# Patient Record
Sex: Female | Born: 1970 | Race: Black or African American | Hispanic: No | Marital: Single | Smoking: Current every day smoker
Health system: Southern US, Community
[De-identification: ages and names within clinical notes are randomized; demographics above are authoritative.]

## PROBLEM LIST (undated history)

## (undated) DIAGNOSIS — F32A Depression, unspecified: Secondary | ICD-10-CM

## (undated) DIAGNOSIS — I10 Essential (primary) hypertension: Secondary | ICD-10-CM

## (undated) DIAGNOSIS — Z973 Presence of spectacles and contact lenses: Secondary | ICD-10-CM

## (undated) DIAGNOSIS — D219 Benign neoplasm of connective and other soft tissue, unspecified: Secondary | ICD-10-CM

## (undated) DIAGNOSIS — G4733 Obstructive sleep apnea (adult) (pediatric): Secondary | ICD-10-CM

## (undated) DIAGNOSIS — G43909 Migraine, unspecified, not intractable, without status migrainosus: Secondary | ICD-10-CM

## (undated) DIAGNOSIS — E119 Type 2 diabetes mellitus without complications: Secondary | ICD-10-CM

## (undated) DIAGNOSIS — F419 Anxiety disorder, unspecified: Secondary | ICD-10-CM

## (undated) DIAGNOSIS — M549 Dorsalgia, unspecified: Secondary | ICD-10-CM

## (undated) HISTORY — DX: Obstructive sleep apnea (adult) (pediatric): G47.33

## (undated) HISTORY — DX: Type 2 diabetes mellitus without complications: E11.9

## (undated) HISTORY — PX: OTHER SURGICAL HISTORY: SHX169

---

## 1997-12-01 ENCOUNTER — Emergency Department (HOSPITAL_COMMUNITY): Admission: EM | Admit: 1997-12-01 | Discharge: 1997-12-01 | Payer: Self-pay | Admitting: Emergency Medicine

## 1997-12-26 ENCOUNTER — Inpatient Hospital Stay (HOSPITAL_COMMUNITY): Admission: AD | Admit: 1997-12-26 | Discharge: 1997-12-26 | Payer: Self-pay | Admitting: *Deleted

## 1998-03-04 ENCOUNTER — Emergency Department (HOSPITAL_COMMUNITY): Admission: EM | Admit: 1998-03-04 | Discharge: 1998-03-04 | Payer: Self-pay | Admitting: Emergency Medicine

## 2002-10-15 ENCOUNTER — Emergency Department (HOSPITAL_COMMUNITY): Admission: EM | Admit: 2002-10-15 | Discharge: 2002-10-15 | Payer: Self-pay | Admitting: Emergency Medicine

## 2004-02-27 ENCOUNTER — Other Ambulatory Visit: Admission: RE | Admit: 2004-02-27 | Discharge: 2004-02-27 | Payer: Self-pay | Admitting: Family Medicine

## 2005-04-08 ENCOUNTER — Emergency Department (HOSPITAL_COMMUNITY): Admission: EM | Admit: 2005-04-08 | Discharge: 2005-04-08 | Payer: Self-pay | Admitting: Emergency Medicine

## 2005-05-27 ENCOUNTER — Ambulatory Visit (HOSPITAL_COMMUNITY): Admission: RE | Admit: 2005-05-27 | Discharge: 2005-05-27 | Payer: Self-pay | Admitting: Chiropractic Medicine

## 2005-08-28 ENCOUNTER — Ambulatory Visit: Payer: Self-pay | Admitting: Family Medicine

## 2005-09-28 ENCOUNTER — Ambulatory Visit: Payer: Self-pay | Admitting: Family Medicine

## 2005-11-02 ENCOUNTER — Ambulatory Visit: Payer: Self-pay | Admitting: Family Medicine

## 2005-12-11 ENCOUNTER — Ambulatory Visit: Payer: Self-pay | Admitting: Family Medicine

## 2006-09-06 ENCOUNTER — Ambulatory Visit: Payer: Self-pay | Admitting: Family Medicine

## 2008-02-02 ENCOUNTER — Ambulatory Visit: Payer: Self-pay | Admitting: Family Medicine

## 2008-03-19 ENCOUNTER — Emergency Department (HOSPITAL_COMMUNITY): Admission: EM | Admit: 2008-03-19 | Discharge: 2008-03-19 | Payer: Self-pay | Admitting: Emergency Medicine

## 2008-03-26 ENCOUNTER — Encounter: Admission: RE | Admit: 2008-03-26 | Discharge: 2008-03-26 | Payer: Self-pay | Admitting: Family Medicine

## 2008-03-26 ENCOUNTER — Ambulatory Visit: Payer: Self-pay | Admitting: Family Medicine

## 2008-04-04 ENCOUNTER — Ambulatory Visit: Payer: Self-pay | Admitting: Family Medicine

## 2010-01-17 ENCOUNTER — Encounter: Admission: RE | Admit: 2010-01-17 | Discharge: 2010-01-17 | Payer: Self-pay | Admitting: Family Medicine

## 2010-01-24 ENCOUNTER — Ambulatory Visit (HOSPITAL_COMMUNITY): Admission: RE | Admit: 2010-01-24 | Discharge: 2010-01-24 | Payer: Self-pay | Admitting: Family Medicine

## 2010-04-20 ENCOUNTER — Encounter: Payer: Self-pay | Admitting: Chiropractic Medicine

## 2012-03-11 ENCOUNTER — Emergency Department (HOSPITAL_COMMUNITY): Payer: BC Managed Care – PPO

## 2012-03-11 ENCOUNTER — Encounter (HOSPITAL_COMMUNITY): Payer: Self-pay | Admitting: Emergency Medicine

## 2012-03-11 ENCOUNTER — Emergency Department (HOSPITAL_COMMUNITY)
Admission: EM | Admit: 2012-03-11 | Discharge: 2012-03-11 | Disposition: A | Discharge status: Home / Self Care | Payer: BC Managed Care – PPO | Attending: Emergency Medicine | Admitting: Emergency Medicine

## 2012-03-11 DIAGNOSIS — F17200 Nicotine dependence, unspecified, uncomplicated: Secondary | ICD-10-CM | POA: Insufficient documentation

## 2012-03-11 DIAGNOSIS — I10 Essential (primary) hypertension: Secondary | ICD-10-CM | POA: Insufficient documentation

## 2012-03-11 DIAGNOSIS — R0789 Other chest pain: Secondary | ICD-10-CM

## 2012-03-11 DIAGNOSIS — R071 Chest pain on breathing: Secondary | ICD-10-CM | POA: Insufficient documentation

## 2012-03-11 DIAGNOSIS — Z79899 Other long term (current) drug therapy: Secondary | ICD-10-CM | POA: Insufficient documentation

## 2012-03-11 HISTORY — DX: Essential (primary) hypertension: I10

## 2012-03-11 LAB — BASIC METABOLIC PANEL
Calcium: 9 mg/dL (ref 8.4–10.5)
Chloride: 102 mEq/L (ref 96–112)
Creatinine, Ser: 0.69 mg/dL (ref 0.50–1.10)
GFR calc non Af Amer: >90 mL/min (ref >90)
Potassium: 3.9 mEq/L (ref 3.5–5.1)

## 2012-03-11 LAB — CBC WITH DIFFERENTIAL/PLATELET
Eosinophils Relative: 2 % (ref 0–5)
MCH: 28.5 pg (ref 26.0–34.0)
MCHC: 34.3 g/dL (ref 30.0–36.0)
Monocytes Absolute: 0.5 10*3/uL (ref 0.1–1.0)
Neutro Abs: 3.5 10*3/uL (ref 1.7–7.7)
Platelets: 216 10*3/uL (ref 150–400)
WBC: 6.5 10*3/uL (ref 4.0–10.5)

## 2012-03-11 LAB — POCT I-STAT TROPONIN I: Troponin i, poc: 0 ng/mL (ref 0.00–0.08)

## 2012-03-11 MED ORDER — MORPHINE SULFATE 4 MG/ML IJ SOLN
4.0000 mg | Freq: Once | INTRAMUSCULAR | Status: DC
  Filled 2012-03-11: qty 1

## 2012-03-11 MED ORDER — HYDROCODONE-ACETAMINOPHEN 5-325 MG PO TABS: 1.0000 | ORAL_TABLET | Freq: Four times a day (QID) | ORAL | Status: DC | PRN

## 2012-03-11 MED ORDER — IBUPROFEN 800 MG PO TABS: 800.0000 mg | ORAL_TABLET | Freq: Three times a day (TID) | ORAL | Status: DC | PRN

## 2012-03-11 MED ORDER — HYDROCODONE-ACETAMINOPHEN 5-325 MG PO TABS
1.0000 | ORAL_TABLET | Freq: Once | ORAL | Status: AC
  Administered 2012-03-11: 1 via ORAL
  Filled 2012-03-11: qty 1

## 2012-03-11 MED ORDER — IBUPROFEN 800 MG PO TABS
800.0000 mg | ORAL_TABLET | Freq: Once | ORAL | Status: AC
  Administered 2012-03-11: 800 mg via ORAL
  Filled 2012-03-11: qty 1

## 2012-03-11 MED ORDER — KETOROLAC TROMETHAMINE 30 MG/ML IJ SOLN
30.0000 mg | Freq: Once | INTRAMUSCULAR | Status: DC
  Filled 2012-03-11: qty 1

## 2012-03-11 NOTE — ED Notes (Signed)
Pt c/o left sided CP worse with movement and palpation starting last night; pt sts hx of similar in past but this has lasted longer

## 2012-03-12 NOTE — ED Provider Notes (Signed)
History     CSN: [id number]  Arrival date & time 03/11/12  8295   First MD Initiated Contact with Patient 03/11/12 1215      Chief Complaint  Patient presents with  . Chest Pain    (Consider location/radiation/quality/duration/timing/severity/associated sxs/prior treatment) HPI The patient began having chest pain yesterday that has been constant. The patient states that she took Tylenol with minimal relief. The patient states that she has more pain with movement, palpation and deep breathing. The patient denies shortness of breath, headache, nausea, vomiting, abdominal pain, back pain, fever, cough, dizziness, or syncope. The patient states that she has had similar pain in the past. She states that it was a musclar strain. The patient denies any trauma. The patient does do lifting at work.  Past Medical History  Diagnosis Date  . Hypertension     History reviewed. No pertinent past surgical history.  History reviewed. No pertinent family history.  History  Substance Use Topics  . Smoking status: Current Every Day Smoker  . Smokeless tobacco: Not on file  . Alcohol Use: Yes    OB History    Grav Para Term Preterm Abortions TAB SAB Ect Mult Living                  Review of Systems All other systems negative except as documented in the HPI. All pertinent positives and negatives as reviewed in the HPI.  Allergies  Review of patient's allergies indicates no known allergies.  Home Medications   Current Outpatient Rx  Name  Route  Sig  Dispense  Refill  . ACETAMINOPHEN 500 MG PO TABS   Oral   Take 1,000 mg by mouth every 6 (six) hours as needed. For pain         . AMLODIPINE-OLMESARTAN 5-20 MG PO TABS   Oral   Take 1 tablet by mouth daily.         Marland Kitchen HYDROCODONE-ACETAMINOPHEN 5-325 MG PO TABS   Oral   Take 1 tablet by mouth every 6 (six) hours as needed for pain.   15 tablet   0   . IBUPROFEN 800 MG PO TABS   Oral   Take 1 tablet (800 mg total) by mouth  every 8 (eight) hours as needed for pain.   21 tablet   0     BP 138/89  Pulse 68  Temp 98.7 F (37.1 C) (Oral)  Resp 16  SpO2 99%  LMP 02/26/2012  Physical Exam  Nursing note and vitals reviewed. Constitutional: She is oriented to person, place, and time. She appears well-developed and well-nourished.  HENT:  Head: Normocephalic and atraumatic.  Mouth/Throat: Oropharynx is clear and moist.  Eyes: Pupils are equal, round, and reactive to light.  Neck: Normal range of motion. Neck supple.  Cardiovascular: Normal rate, regular rhythm and normal heart sounds.  Exam reveals no gallop and no friction rub.   No murmur heard. Pulmonary/Chest: Effort normal and breath sounds normal. She exhibits tenderness.    Neurological: She is alert and oriented to person, place, and time.  Skin: Skin is warm and dry. No rash noted.    ED Course  Procedures (including critical care time)  Labs Reviewed  BASIC METABOLIC PANEL - Abnormal; Notable for the following:    Sodium 134 (*)     Glucose, Bld 110 (*)     All other components within normal limits  CBC WITH DIFFERENTIAL  POCT I-STAT TROPONIN I  POCT I-STAT TROPONIN I  LAB REPORT - SCANNED   Dg Chest 2 View  03/11/2012  *RADIOLOGY REPORT*  Clinical Data: Chest pain  CHEST - 2 VIEW  Comparison: 04/08/2005  Findings: Lungs are clear. No pleural effusion or pneumothorax.  Cardiomediastinal silhouette is within normal limits.  Mild degenerative changes of the visualized thoracolumbar spine.  IMPRESSION: No evidence of acute cardiopulmonary disease.   Original Report Authenticated By: Charline Bills, M.D.      1. Chest wall pain       MDM  The patient most likely has chest wall pain based on her HPI and PE. The patient is advised to return here as needed.   MDM Reviewed: nursing note and vitals Reviewed previous: labs Interpretation: labs, ECG and x-ray    Date: 03/12/2012  Rate: 84  Rhythm: normal sinus rhythm  QRS Axis:  normal  Intervals: normal  ST/T Wave abnormalities: normal  Conduction Disutrbances:none  Narrative Interpretation:   Old EKG Reviewed: none available         Carlyle Dolly, PA-C 03/12/12 [phone]

## 2012-03-13 NOTE — ED Provider Notes (Signed)
Medical screening examination/treatment/procedure(s) were performed by non-physician practitioner and as supervising physician I was immediately available for consultation/collaboration.   Gerhard Munch, MD 03/13/12 [phone]

## 2012-07-18 ENCOUNTER — Emergency Department (INDEPENDENT_AMBULATORY_CARE_PROVIDER_SITE_OTHER)
Admission: EM | Admit: 2012-07-18 | Discharge: 2012-07-18 | Disposition: A | Discharge status: Home / Self Care | Payer: BC Managed Care – PPO | Source: Home / Self Care | Attending: Emergency Medicine | Admitting: Emergency Medicine

## 2012-07-18 ENCOUNTER — Encounter (HOSPITAL_COMMUNITY): Payer: Self-pay | Admitting: Emergency Medicine

## 2012-07-18 DIAGNOSIS — J309 Allergic rhinitis, unspecified: Secondary | ICD-10-CM

## 2012-07-18 MED ORDER — PREDNISONE 20 MG PO TABS: 20.0000 mg | ORAL_TABLET | Freq: Two times a day (BID) | ORAL | Status: DC

## 2012-07-18 MED ORDER — MONTELUKAST SODIUM 10 MG PO TABS: 10.0000 mg | ORAL_TABLET | Freq: Every day | ORAL | Status: DC

## 2012-07-18 MED ORDER — FEXOFENADINE-PSEUDOEPHED ER 60-120 MG PO TB12: 1.0000 | ORAL_TABLET | Freq: Two times a day (BID) | ORAL | Status: DC

## 2012-07-18 MED ORDER — METHYLPREDNISOLONE ACETATE 80 MG/ML IJ SUSP
80.0000 mg | Freq: Once | INTRAMUSCULAR | Status: AC
  Administered 2012-07-18: 80 mg via INTRAMUSCULAR

## 2012-07-18 MED ORDER — AMOXICILLIN 500 MG PO CAPS: 1000.0000 mg | ORAL_CAPSULE | Freq: Three times a day (TID) | ORAL | Status: DC

## 2012-07-18 MED ORDER — FLUTICASONE PROPIONATE 50 MCG/ACT NA SUSP: 2.0000 | Freq: Every day | NASAL | Status: DC

## 2012-07-18 NOTE — ED Notes (Signed)
Pt c/o allergies since Sat Sx include: HA, nasal congestion, runny nose Denies: f/v/n/d Taking Nyquil for allergies and benadryl w/temp relief  She is alert and oriented w/no signs of acute distress.

## 2012-07-18 NOTE — ED Provider Notes (Signed)
Chief Complaint:   Chief Complaint  Patient presents with  . Allergies    History of Present Illness:   Angel Tucker is a 42 year old female with a three-year history of recurring springtime pollen allergies. She had a flareup beginning 3 days ago with headache, nasal congestion, rhinorrhea, sneezing, nasal itching, itchy watery eyes, sore throat, postnasal drip, slight cough, abdominal pain, and constipation. She denies fever or chills. She's had no wheezing or difficulty breathing. No nausea or vomiting.  Review of Systems:  Other than noted above, the patient denies any of the following symptoms. Systemic:  No fever, chills, sweats, fatigue, myalgias, headache, or anorexia. Eye:  No redness, itching, watering, pain or drainage. ENT:  No earache, ear congestion, nasal congestion, sneezing, nasal itching, rhinorrhea, sinus pressure, sinus pain, post nasal drip, or sore throat. Lungs:  No cough, sputum production, wheezing, shortness of breath, or chest pain. Skin:  No rash or itching.  PMFSH:  Past medical history, family history, social history, meds, and allergies were reviewed.  No history of asthma. No use of tobacco. She has no medication allergies. She takes Azore for high blood pressure. She's not pregnant or breast-feeding.  Physical Exam:   Vital signs:  BP 148/107  Pulse 106  Temp(Src) 99.7 F (37.6 C) (Oral)  Resp 24  SpO2 95% General:  Alert, in no distress. Eye:  No conjunctival injection or drainage. Lids were normal. ENT:  TMs and canals were normal, without erythema or inflammation.  Nasal mucosa was congested, pale and boggy with clear, watery drainage.  Mucous membranes were moist.  Pharynx was clear, without exudate or drainage.  There were no oral ulcerations or lesions. Neck:  Supple, no adenopathy, tenderness or mass. Lungs:  No respiratory distress.  Lungs were clear to auscultation, without wheezes, rales or rhonchi.  Breath sounds were clear and equal  bilaterally. Heart:  Regular rhythm, without gallops, murmers or rubs. Skin:  Clear, warm, and dry, without rash or lesions.  Course in Urgent Care Center:   Given Depo-Medrol 80 mg IM.  Assessment:  The encounter diagnosis was Allergic rhinitis.  Plan:   1.  The following meds were prescribed:   New Prescriptions   AMOXICILLIN (AMOXIL) 500 MG CAPSULE    Take 2 capsules (1,000 mg total) by mouth 3 (three) times daily.   FEXOFENADINE-PSEUDOEPHEDRINE (ALLEGRA-D) 60-120 MG PER TABLET    Take 1 tablet by mouth every 12 (twelve) hours.   FLUTICASONE (FLONASE) 50 MCG/ACT NASAL SPRAY    Place 2 sprays into the nose daily.   MONTELUKAST (SINGULAIR) 10 MG TABLET    Take 1 tablet (10 mg total) by mouth at bedtime.   PREDNISONE (DELTASONE) 20 MG TABLET    Take 1 tablet (20 mg total) by mouth 2 (two) times daily.   2.  The patient was instructed in symptomatic care and handouts were given. 3.  The patient was told to return if becoming worse in any way, if no better in 3 or 4 days, and given some red flag symptoms such as fever or difficulty breathing that would indicate earlier return. 4.  The patient was also instructed in allergen avoidance.  Follow up:  The patient was told to follow up with Dr. Schuyler Callas for allergy evaluation and treatment.    Reuben Likes, MD 07/18/12 [phone]

## 2014-09-04 ENCOUNTER — Ambulatory Visit: Payer: Self-pay | Admitting: Family

## 2014-09-04 ENCOUNTER — Encounter: Payer: Self-pay | Admitting: Family

## 2014-09-04 ENCOUNTER — Encounter (INDEPENDENT_AMBULATORY_CARE_PROVIDER_SITE_OTHER): Payer: Self-pay

## 2014-09-04 ENCOUNTER — Ambulatory Visit (INDEPENDENT_AMBULATORY_CARE_PROVIDER_SITE_OTHER): Payer: BLUE CROSS/BLUE SHIELD | Admitting: Family

## 2014-09-04 VITALS — BP 160/98 | HR 86 | Temp 98.4°F | Resp 18 | Ht 62.0 in | Wt 219.1 lb

## 2014-09-04 DIAGNOSIS — R221 Localized swelling, mass and lump, neck: Secondary | ICD-10-CM

## 2014-09-04 DIAGNOSIS — I10 Essential (primary) hypertension: Secondary | ICD-10-CM | POA: Diagnosis not present

## 2014-09-04 DIAGNOSIS — Z1231 Encounter for screening mammogram for malignant neoplasm of breast: Secondary | ICD-10-CM | POA: Diagnosis not present

## 2014-09-04 HISTORY — DX: Encounter for screening mammogram for malignant neoplasm of breast: Z12.31

## 2014-09-04 HISTORY — DX: Localized swelling, mass and lump, neck: R22.1

## 2014-09-04 HISTORY — DX: Essential (primary) hypertension: I10

## 2014-09-04 MED ORDER — VALSARTAN-HYDROCHLOROTHIAZIDE 80-12.5 MG PO TABS: 1.0000 | ORAL_TABLET | Freq: Every day | ORAL | Status: DC

## 2014-09-04 NOTE — Progress Notes (Signed)
Pre visit review using our clinic review tool, if applicable. No additional management support is needed unless otherwise documented below in the visit note. 

## 2014-09-04 NOTE — Assessment & Plan Note (Signed)
Referral for mammogram scheduling sent.

## 2014-09-04 NOTE — Progress Notes (Signed)
Subjective:    Patient ID: Angel Tucker, female    DOB: 1970/11/06, 44 y.o.   MRN: [id number]  Chief Complaint  Patient presents with  . Establish Care    has a mass on the back of her neck that she would like checked out, x1 month     HPI:  OSA [name] is a 44 y.o. female with a PMH of hypertension who presents today for an office visit to establish care.     1.) Mass on neck - Associated symptom a bump located on the back of her neck has been there for about a month. Indicates there was some pain that is described as sharp pain with a severity of 10/10. Modifying factors include Aleve which did help with the pain. Recently has improved some and the pain has slowly improved. Denies recent antibiotic use.  2.) Hypertension - Not currently maintained on medication. In the past she has been prescribed Azor. Has not taken the medication in about a year and has been managing it with lifestyle. Does have occasional pressure headache.   3.) Need for Mammogram - due for mammogram in requesting referral to have mammogram completed.  Allergies  Allergen Reactions  . Pollen Extract      Outpatient Prescriptions Prior to Visit  Medication Sig Dispense Refill  . acetaminophen (TYLENOL) 500 MG tablet Take 1,000 mg by mouth every 6 (six) hours as needed. For pain    . amLODipine-olmesartan (AZOR) 5-20 MG per tablet Take 1 tablet by mouth daily.    Marland Kitchen amoxicillin (AMOXIL) 500 MG capsule Take 2 capsules (1,000 mg total) by mouth 3 (three) times daily. 60 capsule 0  . fexofenadine-pseudoephedrine (ALLEGRA-D) 60-120 MG per tablet Take 1 tablet by mouth every 12 (twelve) hours. 30 tablet 5  . fluticasone (FLONASE) 50 MCG/ACT nasal spray Place 2 sprays into the nose daily. 16 g 3  . HYDROcodone-acetaminophen (NORCO/VICODIN) 5-325 MG per tablet Take 1 tablet by mouth every 6 (six) hours as needed for pain. 15 tablet 0  . ibuprofen (ADVIL,MOTRIN) 800 MG tablet Take 1 tablet (800 mg total) by  mouth every 8 (eight) hours as needed for pain. 21 tablet 0  . montelukast (SINGULAIR) 10 MG tablet Take 1 tablet (10 mg total) by mouth at bedtime. 30 tablet 3  . predniSONE (DELTASONE) 20 MG tablet Take 1 tablet (20 mg total) by mouth 2 (two) times daily. 10 tablet 0   No facility-administered medications prior to visit.     Past Medical History  Diagnosis Date  . Hypertension      History reviewed. No pertinent past surgical history.   Family History  Problem Relation Age of Onset  . Breast cancer Mother   . Hypertension Mother   . Diabetes Father   . Hypertension Father      History   Social History  . Marital Status: Married    Spouse Name: N/A  . Number of Children: 3  . Years of Education: 14   Occupational History  . Collector    Social History Main Topics  . Smoking status: Current Every Day Smoker -- 0.50 packs/day for 20 years    Types: Cigarettes  . Smokeless tobacco: Never Used  . Alcohol Use: No  . Drug Use: No  . Sexual Activity: Yes    Birth Control/ Protection: None   Other Topics Concern  . Not on file   Social History Narrative   Fun:     Review of  Systems  Constitutional: Negative for fever and chills.  Respiratory: Negative for chest tightness and shortness of breath.   Cardiovascular: Negative for chest pain, palpitations and leg swelling.  Neurological: Positive for headaches.      Objective:    BP 160/98 mmHg  Pulse 86  Temp(Src) 98.4 F (36.9 C) (Oral)  Resp 18  Ht 5\' 2"  (1.575 m)  Wt 219 lb 1.9 oz (99.392 kg)  BMI 40.07 kg/m2  SpO2 99% Nursing note and vital signs reviewed.  Physical Exam  Constitutional: She is oriented to person, place, and time. She appears well-developed and well-nourished. No distress.  Cardiovascular: Normal rate, regular rhythm, normal heart sounds and intact distal pulses.   Pulmonary/Chest: Effort normal and breath sounds normal.  Lymphadenopathy:  Small 1 mm, firm, nonmobile nontender  mass noted in the area of the cervical lymph nodes.  Neurological: She is alert and oriented to person, place, and time.  Skin: Skin is warm and dry.  Psychiatric: She has a normal mood and affect. Her behavior is normal. Judgment and thought content normal.       Assessment & Plan:   Problem List Items Addressed This Visit      Cardiovascular and Mediastinum   Essential hypertension    Previous history of hypertension and not currently maintained on medication. Blood pressure today greater than goal 140/90. Discussed risks and benefits of starting medication. Start Diovan HCT. Monitor blood pressure at home. Follow-up in one month to determine effectiveness.      Relevant Medications   valsartan-hydrochlorothiazide (DIOVAN-HCT) 80-12.5 MG per tablet     Other   Lump in neck - Primary    Exam consistent with potential lymph node. Obtain ultrasound for confirmation. Follow-up pending imaging results.      Relevant Orders   US Soft Tissue Head/Neck   Encounter for screening mammogram for breast cancer    Referral for mammogram scheduling sent.      Relevant Orders   MM DIGITAL SCREENING BILATERAL

## 2014-09-04 NOTE — Assessment & Plan Note (Signed)
Exam consistent with potential lymph node. Obtain ultrasound for confirmation. Follow-up pending imaging results.

## 2014-09-04 NOTE — Patient Instructions (Signed)
Thank you for choosing [facility].  Summary/Instructions:  Your prescription(s) have been submitted to your pharmacy or been printed and provided for you. Please take as directed and contact our office if you believe you are having problem(s) with the medication(s) or have any questions.  Referrals have been made during this visit. You should expect to hear back from our schedulers in about 7-10 days in regards to establishing an appointment with the specialists we discussed.   If your symptoms worsen or fail to improve, please contact our office for further instruction, or in case of emergency go directly to the emergency room at the closest medical facility.   Hypertension Hypertension, commonly called high blood pressure, is when the force of blood pumping through your arteries is too strong. Your arteries are the blood vessels that carry blood from your heart throughout your body. A blood pressure reading consists of a higher number over a lower number, such as 110/72. The higher number (systolic) is the pressure inside your arteries when your heart pumps. The lower number (diastolic) is the pressure inside your arteries when your heart relaxes. Ideally you want your blood pressure below 120/80. Hypertension forces your heart to work harder to pump blood. Your arteries may become narrow or stiff. Having hypertension puts you at risk for heart disease, stroke, and other problems.  RISK FACTORS Some risk factors for high blood pressure are controllable. Others are not.  Risk factors you cannot control include:   Race. You may be at higher risk if you are African American.  Age. Risk increases with age.  Gender. Men are at higher risk than women before age 39 years. After age 13, women are at higher risk than men. Risk factors you can control include:  Not getting enough exercise or physical activity.  Being overweight.  Getting too much fat, sugar, calories, or salt in your  diet.  Drinking too much alcohol. SIGNS AND SYMPTOMS Hypertension does not usually cause signs or symptoms. Extremely high blood pressure (hypertensive crisis) may cause headache, anxiety, shortness of breath, and nosebleed. DIAGNOSIS  To check if you have hypertension, your health care provider will measure your blood pressure while you are seated, with your arm held at the level of your heart. It should be measured at least twice using the same arm. Certain conditions can cause a difference in blood pressure between your right and left arms. A blood pressure reading that is higher than normal on one occasion does not mean that you need treatment. If one blood pressure reading is high, ask your health care provider about having it checked again. TREATMENT  Treating high blood pressure includes making lifestyle changes and possibly taking medicine. Living a healthy lifestyle can help lower high blood pressure. You may need to change some of your habits. Lifestyle changes may include:  Following the DASH diet. This diet is high in fruits, vegetables, and whole grains. It is low in salt, red meat, and added sugars.  Getting at least 2 hours of brisk physical activity every week.  Losing weight if necessary.  Not smoking.  Limiting alcoholic beverages.  Learning ways to reduce stress. If lifestyle changes are not enough to get your blood pressure under control, your health care provider may prescribe medicine. You may need to take more than one. Work closely with your health care provider to understand the risks and benefits. HOME CARE INSTRUCTIONS  Have your blood pressure rechecked as directed by your health care provider.  Take medicines only as directed by your health care provider. Follow the directions carefully. Blood pressure medicines must be taken as prescribed. The medicine does not work as well when you skip doses. Skipping doses also puts you at risk for problems.   Do not  smoke.   Monitor your blood pressure at home as directed by your health care provider. SEEK MEDICAL CARE IF:   You think you are having a reaction to medicines taken.  You have recurrent headaches or feel dizzy.  You have swelling in your ankles.  You have trouble with your vision. SEEK IMMEDIATE MEDICAL CARE IF:  You develop a severe headache or confusion.  You have unusual weakness, numbness, or feel faint.  You have severe chest or abdominal pain.  You vomit repeatedly.  You have trouble breathing. MAKE SURE YOU:   Understand these instructions.  Will watch your condition.  Will get help right away if you are not doing well or get worse. Document Released: 03/16/2005 Document Revised: 07/31/2013 Document Reviewed: 01/06/2013 [facility] Patient Information 2015 [place], [place]. This information is not intended to replace advice given to you by your health care provider. Make sure you discuss any questions you have with your health care provider.

## 2014-09-04 NOTE — Assessment & Plan Note (Signed)
Previous history of hypertension and not currently maintained on medication. Blood pressure today greater than goal 140/90. Discussed risks and benefits of starting medication. Start Diovan HCT. Monitor blood pressure at home. Follow-up in one month to determine effectiveness.

## 2014-09-11 ENCOUNTER — Ambulatory Visit
Admission: RE | Admit: 2014-09-11 | Discharge: 2014-09-11 | Disposition: A | Discharge status: Home / Self Care | Payer: BLUE CROSS/BLUE SHIELD | Source: Ambulatory Visit | Attending: Family | Admitting: Family

## 2014-09-11 ENCOUNTER — Telehealth: Payer: Self-pay | Admitting: Family

## 2014-09-11 DIAGNOSIS — R221 Localized swelling, mass and lump, neck: Secondary | ICD-10-CM

## 2014-09-11 DIAGNOSIS — Z1231 Encounter for screening mammogram for malignant neoplasm of breast: Secondary | ICD-10-CM

## 2014-09-11 NOTE — Telephone Encounter (Signed)
Please inform patient that her mammogram was normal and her ultrasound of her neck showed a lymph node that is most likely benign in nature. We will monitor this as needed.

## 2014-09-12 NOTE — Telephone Encounter (Signed)
Left detailed message letting pt know results below.

## 2014-10-02 ENCOUNTER — Ambulatory Visit: Payer: BLUE CROSS/BLUE SHIELD | Admitting: Family

## 2014-10-02 DIAGNOSIS — Z0289 Encounter for other administrative examinations: Secondary | ICD-10-CM

## 2014-10-04 ENCOUNTER — Other Ambulatory Visit: Payer: Self-pay

## 2014-10-04 DIAGNOSIS — I10 Essential (primary) hypertension: Secondary | ICD-10-CM

## 2014-10-04 MED ORDER — VALSARTAN-HYDROCHLOROTHIAZIDE 80-12.5 MG PO TABS: 1.0000 | ORAL_TABLET | Freq: Every day | ORAL | Status: DC

## 2017-08-04 ENCOUNTER — Ambulatory Visit (INDEPENDENT_AMBULATORY_CARE_PROVIDER_SITE_OTHER): Payer: 59 | Admitting: Family

## 2017-08-04 ENCOUNTER — Ambulatory Visit: Payer: BLUE CROSS/BLUE SHIELD | Admitting: Family Medicine

## 2017-08-04 ENCOUNTER — Encounter: Payer: Self-pay | Admitting: Family

## 2017-08-04 VITALS — BP 170/98 | HR 87 | Temp 98.4°F | Ht 62.0 in | Wt 198.0 lb

## 2017-08-04 DIAGNOSIS — Z720 Tobacco use: Secondary | ICD-10-CM

## 2017-08-04 DIAGNOSIS — I10 Essential (primary) hypertension: Secondary | ICD-10-CM | POA: Diagnosis not present

## 2017-08-04 DIAGNOSIS — R4689 Other symptoms and signs involving appearance and behavior: Secondary | ICD-10-CM | POA: Diagnosis not present

## 2017-08-04 MED ORDER — VALSARTAN-HYDROCHLOROTHIAZIDE 160-12.5 MG PO TABS: 1.0000 | ORAL_TABLET | Freq: Every day | ORAL | 1 refills | Status: DC

## 2017-08-04 NOTE — Progress Notes (Signed)
Angel Tucker is a 47 y.o. female with the following history as recorded in [employer]:  Patient Active Problem List   Diagnosis Date Noted  . Lump in neck 09/04/2014  . Essential hypertension 09/04/2014  . Encounter for screening mammogram for breast cancer 09/04/2014    Current Outpatient Medications  Medication Sig Dispense Refill  . valsartan-hydrochlorothiazide (DIOVAN HCT) 160-12.5 MG tablet Take 1 tablet by mouth daily. 30 tablet 1   No current facility-administered medications for this visit.     Allergies: Pollen extract  Past Medical History:  Diagnosis Date  . Hypertension     History reviewed. No pertinent surgical history.  Family History  Problem Relation Age of Onset  . Breast cancer Mother   . Hypertension Mother   . Diabetes Father   . Hypertension Father     Social History   Tobacco Use  . Smoking status: Current Every Day Smoker    Packs/day: 0.50    Years: 20.00    Pack years: 10.00    Types: Cigarettes  . Smokeless tobacco: Never Used  Substance Use Topics  . Alcohol use: No    Subjective:  Patient has history of hypertension; has not been seen in this office in almost 3 years; she did not have a scheduled appointment today- she walked in asking for her medication to be refilled since her eye doctor told her that her blood pressure was elevated. Has been taking Diovan HCT 80/12.5 mg sporadically on and off for the past few years- notes she has been off any type of medication for at least the past 8 months; went to the eye doctor last Friday and pressure was noted to be very high at that appointment- she thinks the pressure was at 190/130; has purchased a home cuff in the past week and is monitoring regularly- numbers are comparable to what is seen today; has had some chest tightness; + smoker;   LMP- 3 weeks ago; planning to see GYN to establish care;   Objective:  Vitals:   08/04/17 1153  BP: (!) 170/98  Pulse: 87  Temp: 98.4 F (36.9 C)   TempSrc: Oral  SpO2: 96%  Weight: 198 lb (89.8 kg)  Height: 5\' 2"  (1.575 m)    General: Well developed, well nourished, in no acute distress  Skin : Warm and dry.  Head: Normocephalic and atraumatic  Eyes: Sclera and conjunctiva clear; pupils round and reactive to light; extraocular movements intact  Ears: External normal; canals clear; tympanic membranes normal  Oropharynx: Pink, supple. No suspicious lesions  Neck: Supple without thyromegaly, adenopathy  Lungs: Respirations unlabored; clear to auscultation bilaterally without wheeze, rales, rhonchi  CVS exam: normal rate and regular rhythm.  Abdomen: Soft; nontender; nondistended; normoactive bowel sounds; no masses or hepatosplenomegaly  Musculoskeletal: No deformities; no active joint inflammation  Extremities: No edema, cyanosis, clubbing  Vessels: Symmetric bilaterally  Neurologic: Alert and oriented; speech intact; face symmetrical; moves all extremities well; CNII-XII intact without focal deficit  Assessment:  1. Essential hypertension   2. Non-compliant behavior   3. Tobacco abuse     Plan:  1. Update EKG today; patient prefers to do labs at next OV; Rx for Diovan HCT 160/ 12.5; follow-up in 2 weeks; 2. Stressed need to take medication as prescribe and follow-up regularly; discussed risk of heart attack, stroke, dialysis with uncontrolled hypertension. 3. Encouraged to quit- she admits she is not ready at this time;  She plans to see GYN in follow-up also.  Return in about 2 weeks (around 08/18/2017) for blood pressure follow-up.  Orders Placed This Encounter  Procedures  . EKG 12-Lead    Requested Prescriptions   Signed Prescriptions Disp Refills  . valsartan-hydrochlorothiazide (DIOVAN HCT) 160-12.5 MG tablet 30 tablet 1    Sig: Take 1 tablet by mouth daily.

## 2017-08-06 ENCOUNTER — Ambulatory Visit: Payer: Self-pay | Admitting: Family

## 2017-08-06 ENCOUNTER — Ambulatory Visit: Payer: BLUE CROSS/BLUE SHIELD | Admitting: Family

## 2017-08-20 ENCOUNTER — Encounter: Payer: Self-pay | Admitting: Family

## 2017-08-20 ENCOUNTER — Ambulatory Visit: Payer: 59 | Admitting: Family

## 2017-08-20 ENCOUNTER — Ambulatory Visit (INDEPENDENT_AMBULATORY_CARE_PROVIDER_SITE_OTHER): Payer: 59 | Admitting: Family

## 2017-08-20 ENCOUNTER — Other Ambulatory Visit (INDEPENDENT_AMBULATORY_CARE_PROVIDER_SITE_OTHER): Payer: 59

## 2017-08-20 VITALS — BP 162/110 | HR 85 | Temp 98.4°F | Ht 62.0 in | Wt 198.0 lb

## 2017-08-20 DIAGNOSIS — I10 Essential (primary) hypertension: Secondary | ICD-10-CM

## 2017-08-20 DIAGNOSIS — R5383 Other fatigue: Secondary | ICD-10-CM

## 2017-08-20 LAB — CBC WITH DIFFERENTIAL/PLATELET
BASOS PCT: 1 % (ref 0.0–3.0)
Basophils Absolute: 0.1 10*3/uL (ref 0.0–0.1)
EOS PCT: 2.8 % (ref 0.0–5.0)
Eosinophils Absolute: 0.2 10*3/uL (ref 0.0–0.7)
HEMATOCRIT: 36.8 % (ref 36.0–46.0)
Hemoglobin: 11.8 g/dL — ABNORMAL LOW (ref 12.0–15.0)
LYMPHS ABS: 2.6 10*3/uL (ref 0.7–4.0)
LYMPHS PCT: 40.6 % (ref 12.0–46.0)
MCHC: 32.1 g/dL (ref 30.0–36.0)
MCV: 79.9 fl (ref 78.0–100.0)
MONOS PCT: 7.2 % (ref 3.0–12.0)
Monocytes Absolute: 0.5 10*3/uL (ref 0.1–1.0)
NEUTROS ABS: 3 10*3/uL (ref 1.4–7.7)
Neutrophils Relative %: 48.4 % (ref 43.0–77.0)
PLATELETS: 244 10*3/uL (ref 150.0–400.0)
RBC: 4.61 Mil/uL (ref 3.87–5.11)
RDW: 14.9 % (ref 11.5–15.5)
WBC: 6.3 10*3/uL (ref 4.0–10.5)

## 2017-08-20 LAB — COMPREHENSIVE METABOLIC PANEL
ALK PHOS: 82 U/L (ref 39–117)
ALT: 30 U/L (ref 0–35)
AST: 39 U/L — ABNORMAL HIGH (ref 0–37)
Albumin: 3.7 g/dL (ref 3.5–5.2)
BILIRUBIN TOTAL: 0.4 mg/dL (ref 0.2–1.2)
BUN: 8 mg/dL (ref 6–23)
CO2: 30 mEq/L (ref 19–32)
CREATININE: 0.84 mg/dL (ref 0.40–1.20)
Calcium: 8.9 mg/dL (ref 8.4–10.5)
Chloride: 104 mEq/L (ref 96–112)
GFR: 93.79 mL/min (ref >60.00)
GLUCOSE: 99 mg/dL (ref 70–99)
Potassium: 3.7 mEq/L (ref 3.5–5.1)
Sodium: 140 mEq/L (ref 135–145)
TOTAL PROTEIN: 7.1 g/dL (ref 6.0–8.3)

## 2017-08-20 LAB — TSH: TSH: 1.06 u[IU]/mL (ref 0.35–4.50)

## 2017-08-20 LAB — VITAMIN B12: Vitamin B-12: 757 pg/mL (ref 211–911)

## 2017-08-20 LAB — VITAMIN D 25 HYDROXY (VIT D DEFICIENCY, FRACTURES): VITD: 20.58 ng/mL — ABNORMAL LOW (ref 30.00–100.00)

## 2017-08-20 MED ORDER — VALSARTAN-HYDROCHLOROTHIAZIDE 320-25 MG PO TABS: 1.0000 | ORAL_TABLET | Freq: Every day | ORAL | 1 refills | Status: DC

## 2017-08-20 NOTE — Progress Notes (Signed)
Angel Tucker is a 47 y.o. female with the following history as recorded in EpicCare:  Patient Active Problem List   Diagnosis Date Noted  . Lump in neck 09/04/2014  . Essential hypertension 09/04/2014  . Encounter for screening mammogram for breast cancer 09/04/2014    Current Outpatient Medications  Medication Sig Dispense Refill  . valsartan-hydrochlorothiazide (DIOVAN HCT) 320-25 MG tablet Take 1 tablet by mouth daily. 30 tablet 1   No current facility-administered medications for this visit.     Allergies: Pollen extract  Past Medical History:  Diagnosis Date  . Hypertension     History reviewed. No pertinent surgical history.  Family History  Problem Relation Age of Onset  . Breast cancer Mother   . Hypertension Mother   . Diabetes Father   . Hypertension Father     Social History   Tobacco Use  . Smoking status: Current Every Day Smoker    Packs/day: 0.50    Years: 20.00    Pack years: 10.00    Types: Cigarettes  . Smokeless tobacco: Never Used  Substance Use Topics  . Alcohol use: No    Subjective:  2 week follow-up on hypertension; Patient presents to follow up on hypertension; was started on Diovan HCT 160/12.5 mg; notes that his feeling somewhat better but pressure continuing to be very high; Denies any chest pain, shortness of breath, blurred vision or headache. Has not taken her medication yet today;  Home cuff in office is reading: 195/130 ( 30 points different systolic/ 20 points different diastolic);   Objective:  Vitals:   08/20/17 1129  BP: (!) 162/110  Pulse: 85  Temp: 98.4 F (36.9 C)  TempSrc: Oral  SpO2: 92%  Weight: 198 lb 0.6 oz (89.8 kg)  Height: _0  (1.575 m)    General: Well developed, well nourished, in no acute distress  Skin : Warm and dry.  Head: Normocephalic and atraumatic  Eyes: Sclera and conjunctiva clear; pupils round and reactive to light; extraocular movements intact  Ears: External normal; canals clear; tympanic  membranes normal  Oropharynx: Pink, supple. No suspicious lesions  Neck: Supple without thyromegaly, adenopathy  Lungs: Respirations unlabored; clear to auscultation bilaterally without wheeze, rales, rhonchi  CVS exam: normal rate and regular rhythm.  Neurologic: Alert and oriented; speech intact; face symmetrical; moves all extremities well; CNII-XII intact without focal deficit   Assessment:  1. Essential hypertension   2. Other fatigue     Plan:  1. Uncontrolled; increase Diovan HCT to 320/25 1 po qd ( she can double up on the Diovan HCT 160/12.5 that she already has); update labs today; follow-up in 2 weeks, sooner prn.   Return in about 2 weeks (around 09/03/2017).  Orders Placed This Encounter  Procedures  . CBC w/Diff    Standing Status:   Future    Standing Expiration Date:   08/20/2018  . Comp Met (CMET)    Standing Status:   Future    Standing Expiration Date:   08/20/2018  . TSH    Standing Status:   Future    Standing Expiration Date:   08/20/2018  . Vitamin D (25 hydroxy)    Standing Status:   Future    Standing Expiration Date:   08/20/2018  . B12    Standing Status:   Future    Standing Expiration Date:   08/20/2018    Requested Prescriptions   Signed Prescriptions Disp Refills  . valsartan-hydrochlorothiazide (DIOVAN HCT) 320-25 MG tablet 30  tablet 1    Sig: Take 1 tablet by mouth daily.

## 2017-08-24 ENCOUNTER — Other Ambulatory Visit: Payer: Self-pay | Admitting: Family

## 2017-08-24 MED ORDER — VITAMIN D (ERGOCALCIFEROL) 1.25 MG (50000 UNIT) PO CAPS: 50000.0000 [IU] | ORAL_CAPSULE | ORAL | 0 refills | Status: AC

## 2017-08-25 ENCOUNTER — Telehealth: Payer: Self-pay | Admitting: Family

## 2017-08-25 MED ORDER — AMLODIPINE BESYLATE 5 MG PO TABS: 5.0000 mg | ORAL_TABLET | Freq: Every day | ORAL | 0 refills | Status: DC

## 2017-08-25 NOTE — Telephone Encounter (Signed)
Please review. Thanks!  

## 2017-08-25 NOTE — Telephone Encounter (Signed)
Called and left message for patient. Also created CRM incase she returns call to clinic regarding message left.

## 2017-08-25 NOTE — Telephone Encounter (Signed)
Copied from [place] [phone]. Topic: Quick Communication - Office Called Patient >> Aug 24, 2017  9:01 AM Marcina Millard, CMA wrote: Reason for CRM: Called and left message for patient to return call to clinic. If she returns call to clinic okay to release following to her:  1) How is her blood pressure doing with the new dose?  2) Her Vitamin D level is low- this could explain some of her fatigue. Mickel Baas has faxed in a script for Vitamin D that she will take once a week for 12 weeks for recheck. She will then need to take 2000 IU Vitamin D3 daily. 3) Keep planned follow-up on her blood pressure please for next visit in June    >> Aug 25, 2017 11:55 AM Marin Olp L wrote: Patient returned call: 1) How is her blood pressure doing with the new dose?   Today 11:44am- 191/142 (wrist cuff for all)            10:31am- 191/117  05/26 4:42pm- 171/109  05/25 3:05pm- 195/122           3:03pm-206/125           3:02-209/127           1:01pm-192/133 05/24 12:04pm-198/135 05/21 12:39pm-195/135 05/20 12:38pm- 181/111 05/19 11:08am- 156/104             7:34am- 136/90              2) Her Vitamin D level is low- this could explain some of her fatigue. Mickel Baas has faxed in a script for Vitamin D that she will take once a week for 12 weeks for recheck. She will then need to take 2000 IU Vitamin D3 daily.  Patient started taking yesterday.  3) Keep planned follow-up on her blood pressure please for next visit in June

## 2017-08-25 NOTE — Telephone Encounter (Signed)
In reviewing these numbers and making adjustments for her machine ( 30 points systolic and 20 points diastolic), she still needs another agent; sending in Amlodipine 5 mg for her to take with the Diovan HCT; keep follow-up.

## 2017-09-03 ENCOUNTER — Ambulatory Visit: Payer: 59 | Admitting: Family

## 2017-09-03 DIAGNOSIS — Z0289 Encounter for other administrative examinations: Secondary | ICD-10-CM

## 2017-09-06 ENCOUNTER — Ambulatory Visit (INDEPENDENT_AMBULATORY_CARE_PROVIDER_SITE_OTHER): Payer: 59 | Admitting: Family

## 2017-09-06 ENCOUNTER — Encounter: Payer: Self-pay | Admitting: Family

## 2017-09-06 VITALS — BP 150/100 | HR 80 | Temp 98.0°F | Ht 62.0 in | Wt 197.0 lb

## 2017-09-06 DIAGNOSIS — I10 Essential (primary) hypertension: Secondary | ICD-10-CM

## 2017-09-06 DIAGNOSIS — R0683 Snoring: Secondary | ICD-10-CM

## 2017-09-06 DIAGNOSIS — Z1231 Encounter for screening mammogram for malignant neoplasm of breast: Secondary | ICD-10-CM | POA: Diagnosis not present

## 2017-09-06 MED ORDER — AMLODIPINE BESYLATE 10 MG PO TABS: 10.0000 mg | ORAL_TABLET | Freq: Every day | ORAL | 1 refills | Status: DC

## 2017-09-06 NOTE — Progress Notes (Signed)
Angel Tucker is a 47 y.o. female with the following history as recorded in EpicCare:  Patient Active Problem List   Diagnosis Date Noted  . Lump in neck 09/04/2014  . Essential hypertension 09/04/2014  . Encounter for screening mammogram for breast cancer 09/04/2014    Current Outpatient Medications  Medication Sig Dispense Refill  . amLODipine (NORVASC) 10 MG tablet Take 1 tablet (10 mg total) by mouth daily. 90 tablet 1  . valsartan-hydrochlorothiazide (DIOVAN HCT) 320-25 MG tablet Take 1 tablet by mouth daily. 30 tablet 1  . Vitamin D, Ergocalciferol, (DRISDOL) 50000 units CAPS capsule Take 1 capsule (50,000 Units total) by mouth every 7 (seven) days for 12 doses. 12 capsule 0   No current facility-administered medications for this visit.     Allergies: Pollen extract  Past Medical History:  Diagnosis Date  . Hypertension     History reviewed. No pertinent surgical history.  Family History  Problem Relation Age of Onset  . Breast cancer Mother   . Hypertension Mother   . Diabetes Father   . Hypertension Father     Social History   Tobacco Use  . Smoking status: Current Every Day Smoker    Packs/day: 0.50    Years: 20.00    Pack years: 10.00    Types: Cigarettes  . Smokeless tobacco: Never Used  Substance Use Topics  . Alcohol use: No    Subjective:  Follow-up on hypertension; now taking Amlodipine 5 mg and Diovan HCT 320/25; is seeing improvement in her numbers but still not at goal; Denies any chest pain, shortness of breath, blurred vision or headache. Also complaining of persisting fatigue- admits she does not snore; sleep is broken due to her work schedule- sleeps from 2 am to 6 am/ takes granddaughter to school and then back to sleep until about 11 am; no prior history of sleep apnea;  Overdue for pap smear/ mammogram- does not have current GYN;   Objective:  Vitals:   09/06/17 1047  BP: (!) 150/100  Pulse: 80  Temp: 98 F (36.7 C)  TempSrc: Oral   SpO2: 95%  Weight: 197 lb (89.4 kg)  Height: 5\' 2"  (1.575 m)    General: Well developed, well nourished, in no acute distress  Skin : Warm and dry.  Head: Normocephalic and atraumatic  Lungs: Respirations unlabored; clear to auscultation bilaterally without wheeze, rales, rhonchi  CVS exam: normal rate and regular rhythm.  Neurologic: Alert and oriented; speech intact; face symmetrical; moves all extremities well; CNII-XII intact without focal deficit  Assessment:  1. Essential hypertension   2. Snoring   3. Screening mammogram, encounter for     Plan:  1. Increase Amlodipine to 10 mg daily; continue Diovan HCT 320/25; follow-up in 1 month, sooner prn. 2. Refer for sleep study;  3. Order updated for mammogram;  Return in 1 month for CPE/ pap smear/ Tdap/ lipid screen.     Return in about 1 month (around 10/04/2017) for CPE/ pap smear.  Orders Placed This Encounter  Procedures  . MM Digital Screening    Standing Status:   Future    Standing Expiration Date:   11/07/2018    Order Specific Question:   Reason for Exam (SYMPTOM  OR DIAGNOSIS REQUIRED)    Answer:   SCREENING MAMMOGRAM    Order Specific Question:   Is the patient pregnant?    Answer:   No    Order Specific Question:   Preferred imaging location?  Answer:   [facility]  . Ambulatory referral to Neurology    Referral Priority:   Routine    Referral Type:   Consultation    Referral Reason:   Specialty Services Required    Requested Specialty:   Neurology    Number of Visits Requested:   1    Requested Prescriptions   Signed Prescriptions Disp Refills  . amLODipine (NORVASC) 10 MG tablet 90 tablet 1    Sig: Take 1 tablet (10 mg total) by mouth daily.

## 2017-09-06 NOTE — Patient Instructions (Signed)
Increase the Amlodipine to 10 mg;

## 2017-09-17 ENCOUNTER — Other Ambulatory Visit: Payer: Self-pay | Admitting: Family

## 2017-09-17 MED ORDER — VALSARTAN-HYDROCHLOROTHIAZIDE 320-25 MG PO TABS: 1.0000 | ORAL_TABLET | Freq: Every day | ORAL | 1 refills | Status: DC

## 2017-10-07 ENCOUNTER — Encounter: Payer: Self-pay | Admitting: Family

## 2017-10-07 ENCOUNTER — Ambulatory Visit (INDEPENDENT_AMBULATORY_CARE_PROVIDER_SITE_OTHER): Payer: 59 | Admitting: Family

## 2017-10-07 ENCOUNTER — Other Ambulatory Visit (INDEPENDENT_AMBULATORY_CARE_PROVIDER_SITE_OTHER): Payer: 59

## 2017-10-07 VITALS — BP 142/90 | HR 84 | Temp 98.2°F | Ht 62.0 in | Wt 195.1 lb

## 2017-10-07 DIAGNOSIS — I10 Essential (primary) hypertension: Secondary | ICD-10-CM

## 2017-10-07 DIAGNOSIS — Z13220 Encounter for screening for lipoid disorders: Secondary | ICD-10-CM

## 2017-10-07 DIAGNOSIS — Z23 Encounter for immunization: Secondary | ICD-10-CM | POA: Diagnosis not present

## 2017-10-07 LAB — COMPREHENSIVE METABOLIC PANEL
ALT: 41 U/L — AB (ref 0–35)
AST: 47 U/L — ABNORMAL HIGH (ref 0–37)
Albumin: 3.7 g/dL (ref 3.5–5.2)
Alkaline Phosphatase: 86 U/L (ref 39–117)
BILIRUBIN TOTAL: 0.4 mg/dL (ref 0.2–1.2)
BUN: 9 mg/dL (ref 6–23)
CO2: 28 meq/L (ref 19–32)
Calcium: 9.3 mg/dL (ref 8.4–10.5)
Chloride: 104 mEq/L (ref 96–112)
Creatinine, Ser: 0.84 mg/dL (ref 0.40–1.20)
GFR: 93.74 mL/min (ref >60.00)
GLUCOSE: 108 mg/dL — AB (ref 70–99)
Potassium: 3.6 mEq/L (ref 3.5–5.1)
SODIUM: 140 meq/L (ref 135–145)
TOTAL PROTEIN: 7.2 g/dL (ref 6.0–8.3)

## 2017-10-07 LAB — LIPID PANEL
Cholesterol: 146 mg/dL (ref 0–200)
HDL: 47.3 mg/dL (ref >39.00)
LDL Cholesterol: 79 mg/dL (ref 0–99)
NONHDL: 98.33
Total CHOL/HDL Ratio: 3
Triglycerides: 97 mg/dL (ref 0.0–149.0)
VLDL: 19.4 mg/dL (ref 0.0–40.0)

## 2017-10-07 MED ORDER — AMLODIPINE-VALSARTAN-HCTZ 10-320-25 MG PO TABS: ORAL_TABLET | ORAL | 3 refills | Status: DC

## 2017-10-07 MED ORDER — METOPROLOL SUCCINATE ER 50 MG PO TB24: 50.0000 mg | ORAL_TABLET | Freq: Every day | ORAL | 1 refills | Status: DC

## 2017-10-07 NOTE — Progress Notes (Signed)
Angel Tucker is a 47 y.o. female with the following history as recorded in EpicCare:  Patient Active Problem List   Diagnosis Date Noted  . Lump in neck 09/04/2014  . Essential hypertension 09/04/2014  . Encounter for screening mammogram for breast cancer 09/04/2014    Current Outpatient Medications  Medication Sig Dispense Refill  . Vitamin D, Ergocalciferol, (DRISDOL) 50000 units CAPS capsule Take 1 capsule (50,000 Units total) by mouth every 7 (seven) days for 12 doses. 12 capsule 0  . amLODIPine-Valsartan-HCTZ 10-320-25 MG TABS Take 1 tablet daily as prescribed 90 tablet 3  . metoprolol succinate (TOPROL-XL) 50 MG 24 hr tablet Take 1 tablet (50 mg total) by mouth daily. Use at nighttime as directed 90 tablet 1   No current facility-administered medications for this visit.     Allergies: Pollen extract  Past Medical History:  Diagnosis Date  . Hypertension     History reviewed. No pertinent surgical history.  Family History  Problem Relation Age of Onset  . Breast cancer Mother   . Hypertension Mother   . Diabetes Father   . Hypertension Father     Social History   Tobacco Use  . Smoking status: Current Every Day Smoker    Packs/day: 0.50    Years: 20.00    Pack years: 10.00    Types: Cigarettes  . Smokeless tobacco: Never Used  Substance Use Topics  . Alcohol use: No    Subjective:  Patient presents to follow-up on her hypertension and was originally scheduled to update her pap smear; she wants to re-schedule this as she had to bring her granddaughter to her office visit today; is pleased to see that her blood pressure readings are improving; taking all of her medications daily as prescribed; Denies any chest pain, shortness of breath, blurred vision or headache. Is scheduled for her mammogram in the next few weeks and has a follow-up on her sleep apnea issues as well.     Objective:  Vitals:   10/07/17 1113  BP: (!) 142/90  Pulse: 84  Temp: 98.2 F  (36.8 C)  TempSrc: Oral  SpO2: 99%  Weight: 195 lb 1.3 oz (88.5 kg)  Height: _0  (1.575 m)    General: Well developed, well nourished, in no acute distress  Skin : Warm and dry.  Head: Normocephalic and atraumatic  Lungs: Respirations unlabored; clear to auscultation bilaterally without wheeze, rales, rhonchi  CVS exam: normal rate and regular rhythm.  Neurologic: Alert and oriented; speech intact; face symmetrical; moves all extremities well; CNII-XII intact without focal deficit   Assessment:  1. Essential hypertension   2. Lipid screening     Plan:  1. Control is improving but still not at goal; change to Exforge HCT so all 3 of her medications can be put in combination- she understands to take this at 1 pm like she has been doing; add Toprol XL 50 mg to take around 12 am when she gets home from work; re-check in 3-4 weeks; update CMP today; 2. Update lipid panel today;  Plan to update pap smear at next OV when she will have childcare for her granddaughter.   Return for 8/2 for pap smear.  Orders Placed This Encounter  Procedures  . Tdap vaccine greater than or equal to 7yo IM  . Comp Met (CMET)    Standing Status:   Future    Number of Occurrences:   1    Standing Expiration Date:   10/07/2018  .  Lipid panel    Standing Status:   Future    Number of Occurrences:   1    Standing Expiration Date:   10/08/2018    Requested Prescriptions   Signed Prescriptions Disp Refills  . metoprolol succinate (TOPROL-XL) 50 MG 24 hr tablet 90 tablet 1    Sig: Take 1 tablet (50 mg total) by mouth daily. Use at nighttime as directed  . amLODIPine-Valsartan-HCTZ 10-320-25 MG TABS 90 tablet 3    Sig: Take 1 tablet daily as prescribed

## 2017-10-28 ENCOUNTER — Ambulatory Visit: Payer: 59

## 2017-10-29 ENCOUNTER — Ambulatory Visit: Payer: 59 | Admitting: Family

## 2017-11-01 ENCOUNTER — Ambulatory Visit: Payer: 59 | Admitting: Family

## 2017-11-01 DIAGNOSIS — Z0289 Encounter for other administrative examinations: Secondary | ICD-10-CM

## 2017-11-09 ENCOUNTER — Telehealth: Payer: Self-pay | Admitting: Family

## 2017-11-09 NOTE — Telephone Encounter (Signed)
Received refill request for Vitamin D; she can switch to taking OTC Vitamin D 3 2000 IU daily; How is her blood pressure doing with the last medication we added?

## 2017-11-10 NOTE — Telephone Encounter (Signed)
Created CRM as I was not able to leave a message because her voicemail was full.

## 2017-11-11 ENCOUNTER — Encounter: Payer: Self-pay | Admitting: Neurology

## 2017-11-11 ENCOUNTER — Ambulatory Visit (INDEPENDENT_AMBULATORY_CARE_PROVIDER_SITE_OTHER): Payer: 59 | Admitting: Neurology

## 2017-11-11 VITALS — BP 119/85 | HR 83 | Ht 62.0 in | Wt 198.0 lb

## 2017-11-11 DIAGNOSIS — F1520 Other stimulant dependence, uncomplicated: Secondary | ICD-10-CM | POA: Insufficient documentation

## 2017-11-11 DIAGNOSIS — Z72821 Inadequate sleep hygiene: Secondary | ICD-10-CM

## 2017-11-11 DIAGNOSIS — F17200 Nicotine dependence, unspecified, uncomplicated: Secondary | ICD-10-CM

## 2017-11-11 DIAGNOSIS — G4719 Other hypersomnia: Secondary | ICD-10-CM

## 2017-11-11 DIAGNOSIS — R0683 Snoring: Secondary | ICD-10-CM

## 2017-11-11 DIAGNOSIS — G4726 Circadian rhythm sleep disorder, shift work type: Secondary | ICD-10-CM

## 2017-11-11 DIAGNOSIS — I10 Essential (primary) hypertension: Secondary | ICD-10-CM

## 2017-11-11 HISTORY — DX: Inadequate sleep hygiene: Z72.821

## 2017-11-11 HISTORY — DX: Circadian rhythm sleep disorder, shift work type: G47.26

## 2017-11-11 HISTORY — DX: Other stimulant dependence, uncomplicated: F15.20

## 2017-11-11 HISTORY — DX: Other hypersomnia: G47.19

## 2017-11-11 HISTORY — DX: Morbid (severe) obesity due to excess calories: E66.01

## 2017-11-11 HISTORY — DX: Snoring: R06.83

## 2017-11-11 HISTORY — DX: Nicotine dependence, unspecified, uncomplicated: F17.200

## 2017-11-11 NOTE — Patient Instructions (Signed)
Please remember to try to maintain good sleep hygiene, which means: Keep a regular sleep and wake schedule, try not to exercise or have a meal within 2 hours of your bedtime, try to keep your bedroom conducive for sleep, that is, cool and dark, without light distractors such as an illuminated alarm clock, and refrain from watching TV right before sleep or in the middle of the night and do not keep the TV or radio on during the night. Also, try not to use or play on electronic devices at bedtime, such as your cell phone, tablet PC or laptop. If you like to read at bedtime on an electronic device, try to dim the background light as much as possible. Do not eat in the middle of the night.   We will request a sleep study.    We will look for leg twitching and snoring or sleep apnea.   For chronic insomnia, you are best followed by a psychiatrist and/or sleep psychologist.   We will call you with the sleep study results and make a follow up appointment if needed.   Hypersomnia Hypersomnia is when you feel extremely tired during the day even though you're getting plenty of sleep at night. You may need to take naps during the day, and you may also be extremely difficult to wake up when you are sleeping. What are the causes? The cause of your hypersomnia may not be known. Hypersomnia may be caused by:  Medicines.  Sleep disorders, such as narcolepsy.  Trauma or injury to your head or nervous system.  Using drugs or alcohol.  Tumors.  Medical conditions, such as depression or hypothyroidism.  Genetics.  What are the signs or symptoms? The main symptoms of hypersomnia include:  Feeling extremely tired throughout the day.  Being very difficult to wake up.  Sleeping for longer and longer periods.  Taking naps throughout the day.  Other symptoms may include:  Feeling: ? Restless. ? Annoyed. ? Anxious. ? Low energy.  Having  difficulty: ? Remembering. ? Speaking. ? Thinking.  Losing your appetite.  Experiencing hallucinations.  How is this diagnosed? Hypersomnia may be diagnosed by:  Medical history and physical exam. This will include a sleep history.  Completing sleep logs.  Tests may also be done, such as: ? Polysomnography. ? Multiple sleep latency test (MSLT).  How is this treated? There is no cure for hypersomnia, but treatment can be very effective in helping manage the condition. Treatment may include:  Lifestyle and sleeping strategies to help cope with the condition.  Stimulant medicines.  Treating any underlying causes of hypersomnia.  Follow these instructions at home:  Take medicines only as directed by your health care provider.  Schedule short naps for when you feel sleepiest during the day. Tell your employer or teachers that you have hypersomnia. You may be able to adjust your schedule to include time for naps.  Avoid drinking alcohol or caffeinated beverages.  Do not eat a heavy meal before bedtime. Eat at about the same times every day.  Do not drive or operate heavy machinery if you are sleepy.  Do not swim or go out on the water without a life jacket.  If possible, adjust your schedule so that you do not have to work or be active at night.  Keep all follow-up visits as directed by your health care provider. This is important. Contact a health care provider if:  You have new symptoms.  Your symptoms get worse. Get help  right away if: You have serious thoughts of hurting yourself or someone else. This information is not intended to replace advice given to you by your health care provider. Make sure you discuss any questions you have with your health care provider. Document Released: 03/06/2002 Document Revised: 08/22/2015 Document Reviewed: 10/19/2013 Elsevier Interactive Patient Education  [employer].

## 2017-11-11 NOTE — Progress Notes (Signed)
SLEEP MEDICINE CLINIC   Provider:  Larey Seat, M D  Primary Care Physician:  Marrian Salvage, [place]   Referring Provider: Susa Griffins, PA   Chief Complaint  Patient presents with  . New Patient (Initial Visit)    pt alone, rm 10. She states that she has excessive daytime sleepiness. She goes to bed at 11 pm she still could sleep all day without problems. This is more evident on the weekend, as during the week she goes to sleep around 12 midnight , after she gets off work and sleeps for 8-12 hours but  still feels tired in AM . She snores in sleep  and she has been told that she stops breathing at times.      HPI:  Angel Tucker is a 47 y.o. AA female patient , seen here  in a revisit  from NP [facility] for a sleep evaluation.   Chief complaint according to patient : see above comment box.     Angel Tucker is a 47 year old right-handed African-American female patient with a history of elevated blood pressures for at least 5 to 6 months.  She feels that she is under significant amount of stress and this may explain her hypertension but she also reports that she does not get enough sleep, she feels tired and excessively sleepy and daytime, she has been told that she snores some nights she will struggle with insomnia difficulties going to sleep and staying asleep.  She also reports migrainous headaches, depression and anxiety to be present.  The patient is an active smoker.  The patient is currently treated on 3 medications for hypertension, one being a combination pill of valsartan and hydrochlorothiazide, the other amlodipine 10 mg daily.  She also takes vitamin D 50,000 units once a week.  Her numbers have improved but are not at goal yet. She drinks 2-3 energy drinks a day and drinks coffee, and still fights sleepiness at work. Sleepiness has been present for years, as has insomnia. She avoids some parties, social events as she prefers to  sleep.   She reports migraines daily for 2-3 weeks after her employer changed to a bright fluorescent light and after this was corrected her headaches got better.    Sleep habits are as follows: she lives with her daughter and her 2 children ( granddaughter is 3 and grandson is 25 month ). Daughter works night shift . Daughter is pregnant again.  She eats  3 meals a day, dinner is between 6-7 PM and she watches TV after wards. Last cigarette at night at 11.30 before bed.  She is not longer exercising after she felt even more tired. She shares her bedroom with her granddaughter - goes to bed by midnight.  Watches TV in the bedroom - with her 46 year old GD sleeping in the same room. She wakes and watches Tv, the Tv wakes her - and she uses the TV as a clock , too.  Alarm will ring at 6.30 to get GD to school. She has been told she snores- and sleeps in all positions. She likes prone sleep. One pillow.  She feels not refreshed nor rested in AM, naps all day or struggles with need to sleep.    Sleep medical history and family sleep history:  Daughter and granddaughter sleepy as well.    Social history:   3 adult children,  Single- separated - stressful. Separated in July 2018, after 5  years of marriage.  Smoker - 1/ 2 ppd , rare alcohol, caffeine yes , and additional energy drinks- [employer] double shots and NOS shots, monsters. Sometimes jittery.  Split shifts, night shifts- works from  4 hours in AM and again 5 pm to 9 Pm. 1 - 10 Pm, late shift.     Review of Systems: Out of a complete 14 system review, the patient complains of only the following symptoms, and all other reviewed systems are negative.  Depression I since March , anxiety- stress, tension, insomnia, crying spells.   Epworth score 17/ 24  , Fatigue severity score 59/63  , depression score n/a    Social History   Socioeconomic History  . Marital status: Legally Separated    Spouse name: Not on file  . Number of children: 3    . Years of education: 65  . Highest education level: Not on file  Occupational History  . Occupation: Ship broker  . Financial resource strain: Not on file  . Food insecurity:    Worry: Not on file    Inability: Not on file  . Transportation needs:    Medical: Not on file    Non-medical: Not on file  Tobacco Use  . Smoking status: Current Every Day Smoker    Packs/day: 0.50    Years: 20.00    Pack years: 10.00    Types: Cigarettes  . Smokeless tobacco: Never Used  Substance and Sexual Activity  . Alcohol use: No  . Drug use: No  . Sexual activity: Yes    Birth control/protection: None  Lifestyle  . Physical activity:    Days per week: Not on file    Minutes per session: Not on file  . Stress: Not on file  Relationships  . Social connections:    Talks on phone: Not on file    Gets together: Not on file    Attends religious service: Not on file    Active member of club or organization: Not on file    Attends meetings of clubs or organizations: Not on file    Relationship status: Not on file  . Intimate partner violence:    Fear of current or ex partner: Not on file    Emotionally abused: Not on file    Physically abused: Not on file    Forced sexual activity: Not on file  Other Topics Concern  . Not on file  Social History Narrative   Fun: [employer] Ewings out with family   Denies religious beliefs that would effect health care.     Family History  Problem Relation Age of Onset  . Breast cancer Mother   . Hypertension Mother   . Diabetes Father   . Hypertension Father     Past Medical History:  Diagnosis Date  . Hypertension     No past surgical history on file.  Current Outpatient Medications  Medication Sig Dispense Refill  . amLODIPine-Valsartan-HCTZ 10-320-25 MG TABS Take 1 tablet daily as prescribed 90 tablet 3  . metoprolol succinate (TOPROL-XL) 50 MG 24 hr tablet Take 1 tablet (50 mg total) by mouth daily. Use at nighttime as directed 90  tablet 1   No current facility-administered medications for this visit.     Allergies as of 11/11/2017 - Review Complete 11/11/2017  Allergen Reaction Noted  . Pollen extract  07/18/2012    Vitals: BP 119/85   Pulse 83   Ht 5\' 2"  (1.575 m)   Wt 198 lb (  89.8 kg)   BMI 36.21 kg/m  Last Weight:  Wt Readings from Last 1 Encounters:  11/11/17 198 lb (89.8 kg)   ZSW:FUXN mass index is 36.21 kg/m.     Last Height:   Ht Readings from Last 1 Encounters:  11/11/17 5\' 2"  (1.575 m)    Physical exam:  General: The patient is awake, alert and appears not in acute distress. The patient is chewing gum. Smells of cigarette smoke.  Head: Normocephalic, atraumatic. Neck is supple. Mallampati 5,  neck circumference:16" . Nasal airflow patent ,  Retrognathia is mildly seen.  Cardiovascular:  Regular rate and rhythm , without  murmurs or carotid bruit, and without distended neck veins. Respiratory: Lungs are clear to auscultation. Skin:  Without evidence of edema, or rash Trunk: BMI is 36.18.    Neurologic exam : The patient is awake and alert, oriented to place and time.   Attention span & concentration ability appears normal.  Speech is fluent,  without dysarthria, dysphonia or aphasia.  Mood and affect are appropriate.  Cranial nerves: Pupils are equal and briskly reactive to light. Funduscopic exam deferred.  Extraocular movements  in vertical and horizontal planes intact and without nystagmus. Visual fields by finger perimetry are intact. Hearing to finger rub intact.   Facial sensation intact to fine touch.Facial motor strength is symmetric and tongue and uvula move midline. Shoulder shrug was symmetrical.  Motor exam:   Normal tone, muscle bulk and symmetric strength in all extremities. Sensory:  Fine touch, pinprick and vibration  normal. Coordination: No change in handwriting.  Finger-to-nose maneuver  normal without evidence of ataxia, dysmetria or tremor. Gait and station:  Patient walks without assistive device and is able unassisted to climb up to the exam table. Strength within normal limits. Stance is stable and normal.  Deep tendon reflexes: in the  upper and lower extremities are symmetric and intact.   Assessment:  After physical and neurologic examination, review of laboratory studies,  Personal review of imaging studies, reports of other /same  Imaging studies, results of polysomnography and / or neurophysiology testing and pre-existing records as far as provided in visit., my assessment is   1) the patient may achieve 5 to 6 hours of nocturnal sleep, sometimes more.  During school year she will rise early to get her granddaughter ready, but she can go back to sleep and may again not off until 10 AM.  Overall it does not matter if she had 6 8 or 10 hours of sleep she is constantly feeling fatigued and excessively daytime sleepy.  She has self medicated to some degree by using caffeine and energy drinks, some of some of which have left her jittery. Her blood pressure has risen over the last year and can be explained by the stress of separation and impending divorce.  It is also a difficult time for her to stop smoking as a stress level is high, however I will offer her to try Chantix.  Her headaches may have to do with al of this- caffeine, weight gain, sleep deprivation and untreated OSA.   We also discussed an earlier bedtime which is hard to achieve in a late shift worker, her night work and around 10 PM that does not mean she is ready for bed within an hour, but she may be able to watch TV in the den and then return to a bedroom that is cool quiet and dark and more conducive to sleep through the night.  I gave  her the [place] paper and a bulletin about insomnia habits, sleep hygiene implementation.  Given her high-grade Mallampati and her neck size as well as her BMI and witnessed snoring I do think this patient very likely has obstructive sleep  apnea. She is insured through [place]- will only allow for HST.     The patient was advised of the nature of the diagnosed disorder , the treatment options and the  risks for general health and wellness arising from not treating the condition.  I spent more than 50 minutes of face to face time with the patient, who had not been educated about basic sleep habits and hygiene and was not asked about her energy drink habit while in work up for HTN. .  Greater than 50% of time was spent in counseling and coordination of care. We have discussed the diagnosis and differential and I answered the patient's questions.    Plan:  Treatment plan and additional workup :  Circadian shift work disorder, sleep hygiene, obesity, snoring, headaches and depression- insomnia, daytime EDS. Daytime fatigue.   HST per Venita Lick, MD 06/07/2160, 44:69 AM  Certified in Neurology by ABPN Certified in [place] by [facility] [address] [place], [place] [address]

## 2017-12-06 ENCOUNTER — Ambulatory Visit (INDEPENDENT_AMBULATORY_CARE_PROVIDER_SITE_OTHER): Payer: 59 | Admitting: Neurology

## 2017-12-06 DIAGNOSIS — R0683 Snoring: Secondary | ICD-10-CM

## 2017-12-06 DIAGNOSIS — G4733 Obstructive sleep apnea (adult) (pediatric): Secondary | ICD-10-CM

## 2017-12-06 DIAGNOSIS — F1520 Other stimulant dependence, uncomplicated: Secondary | ICD-10-CM

## 2017-12-06 DIAGNOSIS — G4719 Other hypersomnia: Secondary | ICD-10-CM

## 2017-12-06 DIAGNOSIS — G4726 Circadian rhythm sleep disorder, shift work type: Secondary | ICD-10-CM

## 2017-12-06 DIAGNOSIS — Z72821 Inadequate sleep hygiene: Secondary | ICD-10-CM

## 2017-12-06 DIAGNOSIS — F17200 Nicotine dependence, unspecified, uncomplicated: Secondary | ICD-10-CM

## 2017-12-06 DIAGNOSIS — I10 Essential (primary) hypertension: Secondary | ICD-10-CM

## 2017-12-10 NOTE — Addendum Note (Signed)
Addended by: Larey Seat on: 12/10/2017 12:24 PM   Modules accepted: Orders

## 2017-12-10 NOTE — Procedures (Signed)
NAME:  Angel Tucker                                                                DOB: [birth year] MEDICAL RECORD No:  [id number]                                                 DOS:  12/07/2017 REFERRING PHYSICIAN: Marrian Salvage, NP STUDY PERFORMED: Home Sleep Study on watch pat  HISTORY: Angel Tucker is a 47 year old right-handed African-American female patient with a history of elevated blood pressures for at least 5 to 6 months.  She feels that she is under a significant amount of stress and this may explain her hypertension- but she also reports that she does not get enough sleep, she feels tired and excessively sleepy in daytime, she has been told that she snores, and some nights she struggles with insomnia- difficulties going to sleep and stay asleep.  She also reports frequent migrainous headaches, depression and anxiety to be present. The patient is an active smoker and shift worker, watches TV in bedroom she shares with her granddaughter.  The patient is currently treated with 3 medications for hypertension, one being a diuretic.  BMI: 36.5; Epworth Sleepiness score at 17/ 24, FSS at 59/ 63 points, and is not treated for depression.   STUDY RESULTS:  Total Recording Time: 6 hours 45 min. Valid 6 h and 10 min.   Total Apnea/Hypopnea Index (AHI): 16.7 /h; RDI:  20.2 /h.  Average Oxygen Saturation:  97 %, Nadir Oxygen Saturation: 89 %  Total Time Oxygen Saturation Below or at 88 %: 0.0 minutes  Average Heart Rate: 72 bpm (between 58 and 101 bpm) IMPRESSION: Moderate Obstructive Sleep apnea, AHI 20.2/h with a strong REM sleep accentuation (AHI 33.8/h). HST also suggested short REM latency.  Thunderous snoring, well into 60 dB range, when not sleeping on her left side.  RECOMMENDATION: In spite of the absence of hypoxemia, I recommend to teat by avoiding any sleep position other than left side and using CPAP. I will order auto CPAP with heated humidity at 5-15 cm water with 2 cm  EPR and a mask of patients choice - please try nasal pillow first.  Patient needs to improve sleep habits, lose weight and make a decision on smoking cessation.  I certify that I have reviewed the raw data recording prior to the issuance of this report in accordance with the standards of the American Academy of Sleep Medicine (AASM). Larey Seat, M.D.  12-09-2017      Medical Director of [facility], accredited by the AASM. Diplomat of the ABPN and ABSM.

## 2017-12-14 ENCOUNTER — Telehealth: Payer: Self-pay | Admitting: Neurology

## 2017-12-14 NOTE — Telephone Encounter (Signed)
Called patient to discuss sleep study results. No answer at this time. LVM for the patient to call back.   

## 2017-12-14 NOTE — Telephone Encounter (Signed)
-----   Message from Larey Seat, MD sent at 12/10/2017 12:21 PM EDT ----- IMPRESSION: Moderate Obstructive Sleep apnea, AHI 20.2/h with a  strong REM sleep accentuation (AHI 33.8/h). HST also suggested  short REM latency.  Thunderous snoring, well into 60 dB range, when not sleeping on  her left side.  RECOMMENDATION: In spite of the absence of hypoxemia, I recommend  to teat by avoiding any sleep position other than left side and  using CPAP. I will order auto CPAP with heated humidity at 5-15  cm water with 2 cm EPR and a mask of patients choice - please try  nasal pillow first.  Patient needs to improve sleep habits, lose weight and make a  decision on smoking cessation. The high degree of fatigue can be related to sleep deprivation, apnea and/or depression.

## 2017-12-14 NOTE — Telephone Encounter (Signed)
Pt has called RN Myriam Jacobson back please call with results

## 2017-12-14 NOTE — Telephone Encounter (Signed)
I called Angel Tucker. I advised Angel Tucker that Dr. Brett Fairy reviewed their sleep study results and found that Angel Tucker has sleep apnea. Dr. Brett Fairy recommends that Angel Tucker starts CPAP. I reviewed PAP compliance expectations with the Angel Tucker. Angel Tucker is agreeable to starting a CPAP. I advised Angel Tucker that an order will be sent to a DME, [facility], and [facility] will call the Angel Tucker within about one week after they file with the Angel Tucker's insurance. [facility] will show the Angel Tucker how to use the machine, fit for masks, and troubleshoot the CPAP if needed. A follow up appt was made for insurance purposes with Dr. Brett Fairy on Dec 18,2019 at 8:30 am. Angel Tucker verbalized understanding to arrive 15 minutes early and bring their CPAP. A letter with all of this information in it will be mailed to the Angel Tucker as a reminder. I verified with the Angel Tucker that the address we have on file is correct. Angel Tucker verbalized understanding of results. Angel Tucker had no questions at this time but was encouraged to call back if questions arise.

## 2018-03-16 ENCOUNTER — Telehealth: Payer: Self-pay | Admitting: Neurology

## 2018-03-16 ENCOUNTER — Encounter: Payer: Self-pay | Admitting: Neurology

## 2018-03-16 ENCOUNTER — Ambulatory Visit: Payer: Self-pay | Admitting: Neurology

## 2018-03-16 NOTE — Telephone Encounter (Signed)
Pt was no show to apt today. It patient calls, in order to stay in the 31-90 day window she needed to be seen by 03/21/18. So offer any cancellations or discuss to work in.

## 2018-03-17 NOTE — Telephone Encounter (Signed)
Unfortunately the slot was a sleep consult spot and we had a urgent patient come through that had to be entered in. I was unable to hold the slot for the length of time. If patient calls back I would check to see if Ward Givens, NP would be willing to allow the patient in her 9 am slot tomorrow morning on Friday. If not I will keep a wait list and offer and something comes available within the next wk.

## 2018-03-17 NOTE — Telephone Encounter (Signed)
Pt called to schedule appt, she was unaware of this appt, she thought it was for another family member. Myriam Jacobson offered 12/23 @ 1pm, pt will check with manager and call back this pm around 3pm. Pt is aware if she does not accept this appt she will be outside of the 31-90 day window.

## 2018-03-28 ENCOUNTER — Encounter: Payer: Self-pay | Admitting: Adult Health

## 2018-03-28 ENCOUNTER — Ambulatory Visit (INDEPENDENT_AMBULATORY_CARE_PROVIDER_SITE_OTHER): Payer: 59 | Admitting: Adult Health

## 2018-03-28 VITALS — BP 122/83 | HR 83 | Ht 62.0 in | Wt 199.0 lb

## 2018-03-28 DIAGNOSIS — Z9989 Dependence on other enabling machines and devices: Secondary | ICD-10-CM

## 2018-03-28 DIAGNOSIS — G4733 Obstructive sleep apnea (adult) (pediatric): Secondary | ICD-10-CM | POA: Diagnosis not present

## 2018-03-28 NOTE — Progress Notes (Signed)
SLEEP MEDICINE CLINIC   Provider:  Larey Seat, M D  Primary Care Physician:  Marrian Salvage, [place]    Chief Complaint  Patient presents with  . Follow-up     Initial  CIPAP follow up room in back hallway,patient alone     HPI:  Angel Tucker is a 47 y.o. AA female patient with PMH of HTN who is being followed in this office after complaints of excessive daytime sleepiness and was found to have moderate OSA after home sleep study and CPAP initiated.   Interval history 03/28/2018: Patient is being seen today for initial CPAP visit.  She did undergo home sleep study on 12/16/2017 which showed a moderate obstructive sleep apnea with AHI 20.2/h with a strong REM sleep accentuation with AHI 33.8/h.  It was recommended for her to initiate CPAP with heated humidity 5 to 15 cm water per 2 cm EPR.   Compliance report shows usage days 80% for greater than 4-hour days 40%.  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  She will lay back down around 7 AM and will wake up around 11 AM as she needs to return to work at 1 PM.  She does endorse compliance when she initially lays down between 2 AM to 6 AM but states she typically will not place it back on at 7 AM to 11 AM.  She is unable to state exact reasoning for not using CPAP but does states she will start to do this.  She does continue to endorse daytime fatigue with Epworth scale 16 and fatigue severity scale 53 which just shows slight improvement from initial testing.  AHI 1.8 and pressure 10.5.   Home sleep test results 12/06/2017 IMPRESSION: Moderate Obstructive Sleep apnea, AHI 20.2/h with a  strong REM sleep accentuation (AHI 33.8/h). HST also suggested  short REM latency.  Thunderous snoring, well into 60 dB range, when not sleeping on  her left side.  RECOMMENDATION: In spite of the absence of hypoxemia, I  recommend  to teat by avoiding any sleep position other than left side and  using CPAP. I will order auto CPAP with heated humidity at 5-15  cm water with 2 cm EPR and a mask of patients choice - please try  nasal pillow first.  Patient needs to improve sleep habits, lose weight and make a  decision on smoking cessation. The high degree of fatigue can be related to sleep deprivation, apnea and/or depression.   Initial visit 11/11/2017 Dr. Brett Fairy: Angel Tucker is a 47 year old right-handed African-American female patient with a history of elevated blood pressures for at least 5 to 6 months.  She feels that she is under significant amount of stress and this may explain her hypertension but she also reports that she does not get enough sleep, she feels tired and excessively sleepy and daytime, she has been told that she snores some nights she will struggle with insomnia difficulties going to sleep and staying asleep.  She also reports migrainous headaches, depression and anxiety to be present.  The patient is an active smoker.  The patient is currently treated on 3 medications for hypertension, one being a combination pill of valsartan and hydrochlorothiazide, the other amlodipine 10 mg daily.  She also takes vitamin D 50,000 units once a week.  Her numbers have improved but  are not at goal yet. She drinks 2-3 energy drinks a day and drinks coffee, and still fights sleepiness at work. Sleepiness has been present for years, as has insomnia. She avoids some parties, social events as she prefers to sleep.      Review of Systems: Out of a complete 14 system review, the patient complains of only the following symptoms, and all other reviewed systems are negative.  Fatigue, insomnia, apnea  Epworth score 16/ 24  , Fatigue severity score 53/63     Social History   Socioeconomic History  . Marital status: Legally Separated    Spouse name: Not on file  . Number of children: 3  . Years of  education: 54  . Highest education level: Not on file  Occupational History  . Occupation: Ship broker  . Financial resource strain: Not on file  . Food insecurity:    Worry: Not on file    Inability: Not on file  . Transportation needs:    Medical: Not on file    Non-medical: Not on file  Tobacco Use  . Smoking status: Current Every Day Smoker    Packs/day: 0.50    Years: 20.00    Pack years: 10.00    Types: Cigarettes  . Smokeless tobacco: Never Used  Substance and Sexual Activity  . Alcohol use: No  . Drug use: No  . Sexual activity: Yes    Birth control/protection: None  Lifestyle  . Physical activity:    Days per week: Not on file    Minutes per session: Not on file  . Stress: Not on file  Relationships  . Social connections:    Talks on phone: Not on file    Gets together: Not on file    Attends religious service: Not on file    Active member of club or organization: Not on file    Attends meetings of clubs or organizations: Not on file    Relationship status: Not on file  . Intimate partner violence:    Fear of current or ex partner: Not on file    Emotionally abused: Not on file    Physically abused: Not on file    Forced sexual activity: Not on file  Other Topics Concern  . Not on file  Social History Narrative   Fun: Elbert Ewings out with family   Denies religious beliefs that would effect health care.     Family History  Problem Relation Age of Onset  . Breast cancer Mother   . Hypertension Mother   . Diabetes Father   . Hypertension Father     Past Medical History:  Diagnosis Date  . Hypertension   . OSA (obstructive sleep apnea)     History reviewed. No pertinent surgical history.  Current Outpatient Medications  Medication Sig Dispense Refill  . amLODIPine-Valsartan-HCTZ 10-320-25 MG TABS Take 1 tablet daily as prescribed 90 tablet 3  . metoprolol succinate (TOPROL-XL) 50 MG 24 hr tablet Take 1 tablet (50 mg total) by mouth daily.  Use at nighttime as directed 90 tablet 1   No current facility-administered medications for this visit.     Allergies as of 03/28/2018 - Review Complete 03/28/2018  Allergen Reaction Noted  . Pollen extract  07/18/2012    Vitals: BP 122/83 (BP Location: Right Arm, Patient Position: Sitting, Cuff Size: Normal)   Pulse 83   Ht 5\' 2"  (1.575 m)   Wt 199 lb (90.3 kg)   BMI 36.40 kg/m  Last Weight:  Wt Readings from Last 1 Encounters:  03/28/18 199 lb (90.3 kg)   ZTI:WPYK mass index is 36.4 kg/m.     Last Height:   Ht Readings from Last 1 Encounters:  03/28/18 5\' 2"  (1.575 m)    Physical exam:  General: The patient is awake, alert and appears not in acute distress. The patient is chewing gum. Smells of cigarette smoke.  Head: Normocephalic, atraumatic. Neck is supple. Mallampati 5,  neck circumference:16". Nasal airflow patent ,  Retrognathia is mildly seen.  Cardiovascular:  Regular rate and rhythm , without  murmurs or carotid bruit, and without distended neck veins. Respiratory: Lungs are clear to auscultation. Skin:  Without evidence of edema, or rash Trunk: BMI is 36.4   Neurologic exam : The patient is awake and alert, oriented to place and time.   Attention span & concentration ability appears normal.  Speech is fluent,  without dysarthria, dysphonia or aphasia.  Mood and affect are appropriate.  Cranial nerves: Pupils are equal and briskly reactive to light. Extraocular movements  in vertical and horizontal planes intact and without nystagmus. Visual fields by finger perimetry are intact. Hearing to finger rub intact. Facial sensation intact to fine touch.Facial motor strength is symmetric and tongue and uvula move midline. Shoulder shrug was symmetrical.  Motor exam:   Normal tone, muscle bulk and symmetric strength in all extremities. Sensory:  Fine touch, pinprick and vibration  normal. Coordination: No change in handwriting.  Finger-to-nose maneuver  normal without  evidence of ataxia, dysmetria or tremor. Gait and station: Patient walks without assistive device and is able unassisted to climb up to the exam table. Strength within normal limits. Stance is stable and normal.   Deep tendon reflexes: in the  upper and lower extremities are symmetric and intact.      Assessment: Angel Tucker is a 47 year old female with complaints of excessive daytime sleepiness and underwent sleep study showing moderate obstructive sleep apnea.  It was recommended for patient to initiate CPAP and was initiated 11/2017.  Patient is being seen today for initial follow-up for CPAP.    Plan:    -Continue use of CPAP and advised compliance at all times while sleeping and/or napping.  Educated her on importance of compliance due to insurance purposes along with continued daytime fatigue symptoms -No need to change any settings on CPAP at this time -Advised patient if she has any concerns regarding function of her machine, to call [facility] -Follow-up in 6 months time or call earlier if needed     Venancio Poisson, [facility]  [facility] [address] [place], [place] [address]  Phone [phone] Fax [phone] Note: This document was prepared with digital dictation and possible smart phrase technology. Any transcriptional errors that result from this process are unintentional.

## 2018-03-28 NOTE — Patient Instructions (Signed)
Your Plan:  Continue use of CPAP - call [facility] to ensure that your machine is reading correctly  Ensure you are using all times during sleep   Follow up in 6 months or call earlier if needed      Thank you for coming to see [place] at [facility]. I hope we have been able to provide you high quality care today.  You may receive a patient satisfaction survey over the next few weeks. We would appreciate your feedback and comments so that we may continue to improve ourselves and the health of our patients.

## 2018-06-07 ENCOUNTER — Other Ambulatory Visit: Payer: Self-pay | Admitting: Family

## 2018-06-07 ENCOUNTER — Telehealth: Payer: Self-pay | Admitting: Family

## 2018-06-07 NOTE — Telephone Encounter (Signed)
Called and left message for patient to return call to clinic and schedule appointment with Mickel Baas for follow up with on her blood pressure along with her medication she is taking as well.

## 2018-06-07 NOTE — Telephone Encounter (Signed)
She needs to be seen for a blood pressure follow-up. According to our last note, she is supposed to be on Exoforge HCT and Toprol XL. She is requesting refills on Diovan HCT and Amlodipine. We need her to bring her bottles of what she has actually been taking to clarify her medications.

## 2018-06-10 ENCOUNTER — Ambulatory Visit: Payer: 59 | Admitting: Family

## 2018-07-01 ENCOUNTER — Encounter: Payer: Self-pay | Admitting: Family

## 2018-07-01 ENCOUNTER — Ambulatory Visit: Payer: 59 | Admitting: Family

## 2018-07-01 ENCOUNTER — Ambulatory Visit (INDEPENDENT_AMBULATORY_CARE_PROVIDER_SITE_OTHER): Payer: 59 | Admitting: Family

## 2018-07-01 DIAGNOSIS — F418 Other specified anxiety disorders: Secondary | ICD-10-CM

## 2018-07-01 DIAGNOSIS — I10 Essential (primary) hypertension: Secondary | ICD-10-CM

## 2018-07-01 MED ORDER — AMLODIPINE BESYLATE 10 MG PO TABS: 10.0000 mg | ORAL_TABLET | Freq: Every day | ORAL | 0 refills | Status: DC

## 2018-07-01 MED ORDER — VALSARTAN-HYDROCHLOROTHIAZIDE 320-25 MG PO TABS: 1.0000 | ORAL_TABLET | Freq: Every day | ORAL | 0 refills | Status: DC

## 2018-07-01 MED ORDER — ESCITALOPRAM OXALATE 10 MG PO TABS: 10.0000 mg | ORAL_TABLET | Freq: Every day | ORAL | 0 refills | Status: DC

## 2018-07-01 NOTE — Progress Notes (Signed)
  Angel Tucker is a 48 y.o. female with the following history as recorded in EpicCare:  Patient Active Problem List   Diagnosis Date Noted  . Morbid obesity ([place]) 11/11/2017  . 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 Tobacco dependence 11/11/2017  . Snoring 11/11/2017  . Excessive daytime sleepiness 11/11/2017  . Poor sleep hygiene 11/11/2017  . 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 Lump in neck 09/04/2014  . Essential hypertension 09/04/2014  . Encounter for screening mammogram for breast cancer 09/04/2014    Current Outpatient Medications  Medication Sig Dispense Refill  . amLODipine (NORVASC) 10 MG tablet Take 10 mg by mouth daily.    . valsartan-hydrochlorothiazide (DIOVAN-HCT) 320-25 MG tablet Take 1 tablet by mouth daily.    . metoprolol succinate (TOPROL-XL) 50 MG 24 hr tablet Take 1 tablet (50 mg total) by mouth daily. Use at nighttime as directed (Patient not taking: Reported on 07/01/2018) 90 tablet 1   No current facility-administered medications for this visit.     Allergies: Pollen extract  Past Medical History:  Diagnosis Date  . Hypertension   . OSA (obstructive sleep apnea)     No past surgical history on file.  Family History  Problem Relation Age of Onset  . Breast cancer Mother   . Hypertension Mother   . Diabetes Father   . Hypertension Father     Social History   Tobacco Use  . Smoking status: Current Every Day Smoker    Packs/day: 0.50    Years: 20.00    Pack years: 10.00    Types: Cigarettes  . Smokeless tobacco: Never Used  Substance Use Topics  . Alcohol use: No    Subjective:   I connected with Angel Tucker on 07/01/18 at  3:40 PM EDT by a video enabled telemedicine application and verified that I am speaking with the correct person using two identifiers.   I discussed the limitations of evaluation and management by telemedicine and the availability of in person appointments. The patient expressed  understanding and agreed to proceed.  Patient is scheduled for blood pressure follow-up; there had been confusion about her medications; not taking ExforgeHCT and Toprol XL; only taking Valsartan HCT and Amlodipine; in general, feeling very good; not checking her pressure regularly; Denies any chest pain, shortness of breath, blurred vision or headache. BP at neurology visit in December was very well controlled; per patient, she has also lost weight- had gotten down to 185 lbs;  Admits that anxiety has always been a problem for her; more issues with current COVID 19 outbreak; would like to try a daily medication; does have CPAP and snoring is better but not sleeping well due to anxiety;    Objective:   General: Well developed, well nourished, in no acute distress  Lungs: Respirations unlabored;  Neurologic: Alert and oriented; speech intact; face symmetrical;    Assessment:  1. Essential hypertension   2. Situational anxiety     Plan:  1. Stable; medication list updated; patient is not currently taking Toprol XL; follow-up in 2 months, sooner prn. 2. Trial of Lexapro 10 mg daily- risks and benefits discussed; follow-up in 2 months, sooner prn.  Pap smear needs to be updated at next OV.   No follow-ups on file.  No orders of the defined types were placed in this encounter.   Requested Prescriptions    No prescriptions requested or ordered in this encounter

## 2018-08-21 ENCOUNTER — Encounter: Payer: Self-pay | Admitting: Adult Health

## 2018-08-30 ENCOUNTER — Telehealth: Payer: Self-pay | Admitting: *Deleted

## 2018-08-30 NOTE — Telephone Encounter (Signed)
Due to current COVID 19 pandemic, our office is severely reducing in office visits until further notice, in order to minimize the risk to our patients and healthcare providers. Unable to get in contact with the patient to convert their office visit with [facility] on 09/05/2018 into a doxy.me visit. I left a voicemail asking the patient to return my call. Office number was provided.     If patient calls back please convert their office visit into a doxy.me visit.

## 2018-09-05 ENCOUNTER — Other Ambulatory Visit: Payer: Self-pay

## 2018-09-05 ENCOUNTER — Ambulatory Visit (INDEPENDENT_AMBULATORY_CARE_PROVIDER_SITE_OTHER): Payer: 59 | Admitting: Adult Health

## 2018-09-05 ENCOUNTER — Encounter: Payer: Self-pay | Admitting: Adult Health

## 2018-09-05 DIAGNOSIS — G4733 Obstructive sleep apnea (adult) (pediatric): Secondary | ICD-10-CM | POA: Diagnosis not present

## 2018-09-05 DIAGNOSIS — Z9989 Dependence on other enabling machines and devices: Secondary | ICD-10-CM

## 2018-09-05 NOTE — Progress Notes (Signed)
PATIENT: Angel Tucker DOB: 05-16-1970  REASON FOR VISIT: follow up HISTORY FROM: patient  Virtual Visit via Video Note  I connected with Angel Tucker on 09/05/18 at 11:00 AM EDT by a video enabled telemedicine application located remotely at [facility] and verified that I am speaking with the correct person using two identifiers who was located at their own home.   I discussed the limitations of evaluation and management by telemedicine and the availability of in person appointments. The patient expressed understanding and agreed to proceed.   PATIENT: Angel Tucker DOB: 1970-10-30  REASON FOR VISIT: follow up HISTORY FROM: patient  HISTORY OF PRESENT ILLNESS: Today 09/05/18:  Angel Tucker is a 48 year old female with a history of obstructive sleep apnea on CPAP.  She returns today for follow-up.  The patient states that she has not been using the machine consistently.  For the last 30 days she is only used it 5 days.  She states that she has been suffering with allergy.  She states that her nose is congested and she is unable to wear the nasal pillows.  She states that she has not seen her primary care provider.  Taking over-the-counter Claritin-D and Mucinex.  HISTORY 03/28/2018: Patient is being seen today for initial CPAP visit.  She did undergo home sleep study on 12/16/2017 which showed a moderate obstructive sleep apnea with AHI 20.2/h with a strong REM sleep accentuation with AHI 33.8/h.  It was recommended for her to initiate CPAP with heated humidity 5 to 15 cm water per 2 cm EPR.   Compliance report shows usage days 80% for greater than 4-hour days 40%.  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  She will lay back down around 7 AM and will wake up around 11 AM as she needs to return to work at 1 PM.  She does endorse compliance  when she initially lays down between 2 AM to 6 AM but states she typically will not place it back on at 7 AM to 11 AM.  She is unable to state exact reasoning for not using CPAP but does states she will start to do this.  She does continue to endorse daytime fatigue with Epworth scale 16 and fatigue severity scale 53 which just shows slight improvement from initial testing.  AHI 1.8 and pressure 10.5.  REVIEW OF SYSTEMS: Out of a complete 14 system review of symptoms, the patient complains only of the following symptoms, and all other reviewed systems are negative.  See HPI  ALLERGIES: Allergies  Allergen Reactions   Pollen Extract     HOME MEDICATIONS: Outpatient Medications Prior to Visit  Medication Sig Dispense Refill   amLODipine (NORVASC) 10 MG tablet Take 1 tablet (10 mg total) by mouth daily. 90 tablet 0   escitalopram (LEXAPRO) 10 MG tablet Take 1 tablet (10 mg total) by mouth daily. 90 tablet 0   metoprolol succinate (TOPROL-XL) 50 MG 24 hr tablet Take 1 tablet (50 mg total) by mouth daily. Use at nighttime as directed (Patient not taking: Reported on 07/01/2018) 90 tablet 1   valsartan-hydrochlorothiazide (DIOVAN-HCT) 320-25 MG tablet Take 1 tablet by mouth daily. 90 tablet 0   No facility-administered medications prior to visit.     PAST MEDICAL HISTORY: Past Medical History:  Diagnosis Date   Hypertension  OSA (obstructive sleep apnea)     PAST SURGICAL HISTORY: No past surgical history on file.  FAMILY HISTORY: Family History  Problem Relation Age of Onset   Breast cancer Mother    Hypertension Mother    Diabetes Father    Hypertension Father     SOCIAL HISTORY: Social History   Socioeconomic History   Marital status: Legally Separated    Spouse name: Not on file   Number of children: 3   Years of education: 14   Highest education level: Not on file  Occupational History   Occupation: Quarry manager  strain: Not on file   Food insecurity:    Worry: Not on file    Inability: Not on file   Transportation needs:    Medical: Not on file    Non-medical: Not on file  Tobacco Use   Smoking status: Current Every Day Smoker    Packs/day: 0.50    Years: 20.00    Pack years: 10.00    Types: Cigarettes   Smokeless tobacco: Never Used  Substance and Sexual Activity   Alcohol use: No   Drug use: No   Sexual activity: Yes    Birth control/protection: None  Lifestyle   Physical activity:    Days per week: Not on file    Minutes per session: Not on file   Stress: Not on file  Relationships   Social connections:    Talks on phone: Not on file    Gets together: Not on file    Attends religious service: Not on file    Active member of club or organization: Not on file    Attends meetings of clubs or organizations: Not on file    Relationship status: Not on file   Intimate partner violence:    Fear of current or ex partner: Not on file    Emotionally abused: Not on file    Physically abused: Not on file    Forced sexual activity: Not on file  Other Topics Concern   Not on file  Social History Narrative   Fun: Elbert Ewings out with family   Denies religious beliefs that would effect health care.       PHYSICAL EXAM Generalized: Well developed, in no acute distress   Neurological examination  Mentation: Alert oriented to time, place, history taking. Follows all commands speech and language fluent Cranial nerve II-XII:Extraocular movements were full. Facial symmetry noted. uvula tongue midline. Head turning and shoulder shrug  were normal and symmetric. Motor: Good strength throughout subjectively per patient Sensory: Sensory testing is intact to soft touch on all 4 extremities subjectively per patient   DIAGNOSTIC DATA (LABS, IMAGING, TESTING) - I reviewed patient records, labs, notes, testing and imaging myself where available.  Lab Results  Component Value Date   WBC  6.3 08/20/2017   HGB 11.8 (L) 08/20/2017   HCT 36.8 08/20/2017   MCV 79.9 08/20/2017   PLT 244.0 08/20/2017      Component Value Date/Time   NA 140 10/07/2017 1152   K 3.6 10/07/2017 1152   CL 104 10/07/2017 1152   CO2 28 10/07/2017 1152   GLUCOSE 108 (H) 10/07/2017 1152   BUN 9 10/07/2017 1152   CREATININE 0.84 10/07/2017 1152   CALCIUM 9.3 10/07/2017 1152   PROT 7.2 10/07/2017 1152   ALBUMIN 3.7 10/07/2017 1152   AST 47 (H) 10/07/2017 1152   ALT 41 (H) 10/07/2017 1152   ALKPHOS 86 10/07/2017  1152   BILITOT 0.4 10/07/2017 1152   GFRNONAA >90 03/11/2012 1014   GFRAA >90 03/11/2012 1014   Lab Results  Component Value Date   CHOL 146 10/07/2017   HDL 47.30 10/07/2017   LDLCALC 79 10/07/2017   TRIG 97.0 10/07/2017   CHOLHDL 3 10/07/2017   No results found for: HGBA1C Lab Results  Component Value Date   DBZMCEYE23 361 08/20/2017   Lab Results  Component Value Date   TSH 1.06 08/20/2017      ASSESSMENT AND PLAN 48 y.o. year old female  has a past medical history of Hypertension and OSA (obstructive sleep apnea). here with:  1: OSA on CPAP  Patient CPAP download showed that she has not been using the machine consistently.  The patient states that she has been suffering with allergies and congestion is making it difficult for her to use the machine.  I encouraged the patient to schedule follow-up with her primary care to discuss her symptoms.  She voiced understanding.  She will follow-up with our office in 6 months or sooner if needed.   I spent 10 minutes with the patient discussing CPAP usage and plan of care.   Ward Givens, MSN, NP-C 09/05/2018, 11:01 AM [facility] [address], [place], [place] [address] [phone]

## 2018-09-22 ENCOUNTER — Other Ambulatory Visit: Payer: Self-pay | Admitting: Family

## 2018-10-08 ENCOUNTER — Other Ambulatory Visit: Payer: Self-pay | Admitting: Family

## 2019-01-10 ENCOUNTER — Telehealth: Payer: Self-pay | Admitting: Family

## 2019-01-10 ENCOUNTER — Other Ambulatory Visit: Payer: Self-pay | Admitting: Family

## 2019-01-10 MED ORDER — VALSARTAN-HYDROCHLOROTHIAZIDE 320-25 MG PO TABS: 1.0000 | ORAL_TABLET | Freq: Every day | ORAL | 0 refills | Status: DC

## 2019-01-10 NOTE — Telephone Encounter (Signed)
Patient requesting valsartan-hydrochlorothiazide (DIOVAN-HCT) 320-25 MG tablet , informed patient please allow 48 to 72 hour turn around time  [id number] - [place], [place] [phone] (Phone) [phone] (Fax)    Patient requesting annual mamo orders

## 2019-01-13 ENCOUNTER — Ambulatory Visit (INDEPENDENT_AMBULATORY_CARE_PROVIDER_SITE_OTHER): Payer: 59 | Admitting: Family

## 2019-01-13 ENCOUNTER — Other Ambulatory Visit (INDEPENDENT_AMBULATORY_CARE_PROVIDER_SITE_OTHER): Payer: 59

## 2019-01-13 ENCOUNTER — Encounter: Payer: Self-pay | Admitting: Family

## 2019-01-13 ENCOUNTER — Other Ambulatory Visit: Payer: Self-pay

## 2019-01-13 VITALS — BP 132/80 | HR 90 | Temp 98.7°F | Ht 62.0 in | Wt 196.0 lb

## 2019-01-13 DIAGNOSIS — Z124 Encounter for screening for malignant neoplasm of cervix: Secondary | ICD-10-CM

## 2019-01-13 DIAGNOSIS — Z1231 Encounter for screening mammogram for malignant neoplasm of breast: Secondary | ICD-10-CM | POA: Diagnosis not present

## 2019-01-13 DIAGNOSIS — Z0000 Encounter for general adult medical examination without abnormal findings: Secondary | ICD-10-CM | POA: Diagnosis not present

## 2019-01-13 DIAGNOSIS — Z13220 Encounter for screening for lipoid disorders: Secondary | ICD-10-CM

## 2019-01-13 DIAGNOSIS — Z114 Encounter for screening for human immunodeficiency virus [HIV]: Secondary | ICD-10-CM

## 2019-01-13 LAB — COMPREHENSIVE METABOLIC PANEL
ALT: 27 U/L (ref 0–35)
AST: 33 U/L (ref 0–37)
Albumin: 4 g/dL (ref 3.5–5.2)
Alkaline Phosphatase: 94 U/L (ref 39–117)
BUN: 11 mg/dL (ref 6–23)
CO2: 26 mEq/L (ref 19–32)
Calcium: 9.3 mg/dL (ref 8.4–10.5)
Chloride: 103 mEq/L (ref 96–112)
Creatinine, Ser: 0.98 mg/dL (ref 0.40–1.20)
GFR: 73.42 mL/min (ref >60.00)
Glucose, Bld: 104 mg/dL — ABNORMAL HIGH (ref 70–99)
Potassium: 3.2 mEq/L — ABNORMAL LOW (ref 3.5–5.1)
Sodium: 138 mEq/L (ref 135–145)
Total Bilirubin: 0.3 mg/dL (ref 0.2–1.2)
Total Protein: 7.2 g/dL (ref 6.0–8.3)

## 2019-01-13 LAB — CBC WITH DIFFERENTIAL/PLATELET
Basophils Absolute: 0.1 10*3/uL (ref 0.0–0.1)
Basophils Relative: 2.2 % (ref 0.0–3.0)
Eosinophils Absolute: 0.1 10*3/uL (ref 0.0–0.7)
Eosinophils Relative: 1.4 % (ref 0.0–5.0)
HCT: 41.2 % (ref 36.0–46.0)
Hemoglobin: 13.5 g/dL (ref 12.0–15.0)
Lymphocytes Relative: 38.8 % (ref 12.0–46.0)
Lymphs Abs: 2.2 10*3/uL (ref 0.7–4.0)
MCHC: 32.7 g/dL (ref 30.0–36.0)
MCV: 83.5 fl (ref 78.0–100.0)
Monocytes Absolute: 0.5 10*3/uL (ref 0.1–1.0)
Monocytes Relative: 8 % (ref 3.0–12.0)
Neutro Abs: 2.8 10*3/uL (ref 1.4–7.7)
Neutrophils Relative %: 49.6 % (ref 43.0–77.0)
Platelets: 215 10*3/uL (ref 150.0–400.0)
RBC: 4.94 Mil/uL (ref 3.87–5.11)
RDW: 13.5 % (ref 11.5–15.5)
WBC: 5.7 10*3/uL (ref 4.0–10.5)

## 2019-01-13 LAB — LIPID PANEL
Cholesterol: 147 mg/dL (ref 0–200)
HDL: 35.5 mg/dL — ABNORMAL LOW (ref >39.00)
LDL Cholesterol: 83 mg/dL (ref 0–99)
NonHDL: 111.27
Total CHOL/HDL Ratio: 4
Triglycerides: 143 mg/dL (ref 0.0–149.0)
VLDL: 28.6 mg/dL (ref 0.0–40.0)

## 2019-01-13 LAB — TSH: TSH: 1.24 u[IU]/mL (ref 0.35–4.50)

## 2019-01-13 LAB — VITAMIN D 25 HYDROXY (VIT D DEFICIENCY, FRACTURES): VITD: 23.62 ng/mL — ABNORMAL LOW (ref 30.00–100.00)

## 2019-01-13 MED ORDER — FLUCONAZOLE 150 MG PO TABS: 150.0000 mg | ORAL_TABLET | Freq: Once | ORAL | 0 refills | Status: AC

## 2019-01-13 MED ORDER — ESCITALOPRAM OXALATE 10 MG PO TABS: 10.0000 mg | ORAL_TABLET | Freq: Every day | ORAL | 1 refills | Status: DC

## 2019-01-13 MED ORDER — VALSARTAN-HYDROCHLOROTHIAZIDE 320-25 MG PO TABS: 1.0000 | ORAL_TABLET | Freq: Every day | ORAL | 3 refills | Status: DC

## 2019-01-13 NOTE — Patient Instructions (Signed)
Steps to Quit Smoking Smoking tobacco is the leading cause of preventable death. It can affect almost every organ in the body. Smoking puts you and people around you at risk for many serious, long-lasting (chronic) diseases. Quitting smoking can be hard, but it is one of the best things that you can do for your health. It is never too late to quit. How do I get ready to quit? When you decide to quit smoking, make a plan to help you succeed. Before you quit:  Pick a date to quit. Set a date within the next 2 weeks to give you time to prepare.  Write down the reasons why you are quitting. Keep this list in places where you will see it often.  Tell your family, friends, and co-workers that you are quitting. Their support is important.  Talk with your doctor about the choices that may help you quit.  Find out if your health insurance will pay for these treatments.  Know the people, places, things, and activities that make you want to smoke (triggers). Avoid them. What first steps can I take to quit smoking?  Throw away all cigarettes at home, at work, and in your car.  Throw away the things that you use when you smoke, such as ashtrays and lighters.  Clean your car. Make sure to empty the ashtray.  Clean your home, including curtains and carpets. What can I do to help me quit smoking? Talk with your doctor about taking medicines and seeing a counselor at the same time. You are more likely to succeed when you do both.  If you are pregnant or breastfeeding, talk with your doctor about counseling or other ways to quit smoking. Do not take medicine to help you quit smoking unless your doctor tells you to do so. To quit smoking: Quit right away  Quit smoking totally, instead of slowly cutting back on how much you smoke over a period of time.  Go to counseling. You are more likely to quit if you go to counseling sessions regularly. Take medicine You may take medicines to help you quit. Some  medicines need a prescription, and some you can buy over-the-counter. Some medicines may contain a drug called nicotine to replace the nicotine in cigarettes. Medicines may:  Help you to stop having the desire to smoke (cravings).  Help to stop the problems that come when you stop smoking (withdrawal symptoms). Your doctor may ask you to use:  Nicotine patches, gum, or lozenges.  Nicotine inhalers or sprays.  Non-nicotine medicine that is taken by mouth. Find resources Find resources and other ways to help you quit smoking and remain smoke-free after you quit. These resources are most helpful when you use them often. They include:  Online chats with a counselor.  Phone quitlines.  Printed self-help materials.  Support groups or group counseling.  Text messaging programs.  Mobile phone apps. Use apps on your mobile phone or tablet that can help you stick to your quit plan. There are many free apps for mobile phones and tablets as well as websites. Examples include Quit Guide from the CDC and smokefree.gov  What things can I do to make it easier to quit?   Talk to your family and friends. Ask them to support and encourage you.  Call a phone quitline (1-800-QUIT-NOW), reach out to support groups, or work with a counselor.  Ask people who smoke to not smoke around you.  Avoid places that make you want to smoke,   such as: ? Bars. ? Parties. ? Smoke-break areas at work.  Spend time with people who do not smoke.  Lower the stress in your life. Stress can make you want to smoke. Try these things to help your stress: ? Getting regular exercise. ? Doing deep-breathing exercises. ? Doing yoga. ? Meditating. ? Doing a body scan. To do this, close your eyes, focus on one area of your body at a time from head to toe. Notice which parts of your body are tense. Try to relax the muscles in those areas. How will I feel when I quit smoking? Day 1 to 3 weeks Within the first 24 hours,  you may start to have some problems that come from quitting tobacco. These problems are very bad 2-3 days after you quit, but they do not often last for more than 2-3 weeks. You may get these symptoms:  Mood swings.  Feeling restless, nervous, angry, or annoyed.  Trouble concentrating.  Dizziness.  Strong desire for high-sugar foods and nicotine.  Weight gain.  Trouble pooping (constipation).  Feeling like you may vomit (nausea).  Coughing or a sore throat.  Changes in how the medicines that you take for other issues work in your body.  Depression.  Trouble sleeping (insomnia). Week 3 and afterward After the first 2-3 weeks of quitting, you may start to notice more positive results, such as:  Better sense of smell and taste.  Less coughing and sore throat.  Slower heart rate.  Lower blood pressure.  Clearer skin.  Better breathing.  Fewer sick days. Quitting smoking can be hard. Do not give up if you fail the first time. Some people need to try a few times before they succeed. Do your best to stick to your quit plan, and talk with your doctor if you have any questions or concerns. Summary  Smoking tobacco is the leading cause of preventable death. Quitting smoking can be hard, but it is one of the best things that you can do for your health.  When you decide to quit smoking, make a plan to help you succeed.  Quit smoking right away, not slowly over a period of time.  When you start quitting, seek help from your doctor, family, or friends. This information is not intended to replace advice given to you by your health care provider. Make sure you discuss any questions you have with your health care provider. Document Released: 01/10/2009 Document Revised: 06/03/2018 Document Reviewed: 06/04/2018 Elsevier Patient Education  2020 Elsevier Inc.  

## 2019-01-13 NOTE — Progress Notes (Signed)
Angel Tucker is a 48 y.o. female with the following history as recorded in EpicCare:  Patient Active Problem List   Diagnosis Date Noted  . Morbid obesity ([place]) 11/11/2017  . 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 Tobacco dependence 11/11/2017  . Snoring 11/11/2017  . Excessive daytime sleepiness 11/11/2017  . Poor sleep hygiene 11/11/2017  . 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 Lump in neck 09/04/2014  . Essential hypertension 09/04/2014  . Encounter for screening mammogram for breast cancer 09/04/2014    Current Outpatient Medications  Medication Sig Dispense Refill  . escitalopram (LEXAPRO) 10 MG tablet Take 1 tablet (10 mg total) by mouth daily. 90 tablet 1  . valsartan-hydrochlorothiazide (DIOVAN-HCT) 320-25 MG tablet Take 1 tablet by mouth daily. 90 tablet 3  . fluconazole (DIFLUCAN) 150 MG tablet Take 1 tablet (150 mg total) by mouth once for 1 dose. Repeat after 72 hours 2 tablet 0   No current facility-administered medications for this visit.     Allergies: Pollen extract  Past Medical History:  Diagnosis Date  . Hypertension   . OSA (obstructive sleep apnea)     History reviewed. No pertinent surgical history.  Family History  Problem Relation Age of Onset  . Breast cancer Mother   . Hypertension Mother   . Diabetes Father   . Hypertension Father     Social History   Tobacco Use  . Smoking status: Current Every Day Smoker    Packs/day: 0.50    Years: 20.00    Pack years: 10.00    Types: Cigarettes  . Smokeless tobacco: Never Used  Substance Use Topics  . Alcohol use: No    Subjective:  Patient presents today for yearly CPE; in baseline state of health; notes she has only been taking Diovan HCT for her blood pressure; using Lexapro as needed; overdue to see GYN; intermittent use of her CPAP;   LMP- 2 months; irregular x 2 months; tubal ligation  Health Maintenance  Topic Date Due  . HIV Screening  06/11/1986   . PAP SMEAR-Modifier  06/10/1992  . INFLUENZA VACCINE  06/29/2019 (Originally 10/29/2018)  . TETANUS/TDAP  10/08/2027     Review of Systems  Constitutional: Negative.   HENT: Negative.   Eyes: Negative.   Respiratory: Negative.   Cardiovascular: Negative.   Gastrointestinal: Negative.   Genitourinary: Negative.   Musculoskeletal: Negative.   Skin: Negative.   Neurological: Negative.   Endo/Heme/Allergies: Negative.   Psychiatric/Behavioral: Negative.         Objective:  Vitals:   01/13/19 0953  BP: 132/80  Pulse: 90  Temp: 98.7 F (37.1 C)  TempSrc: Oral  SpO2: 98%  Weight: 196 lb 0.6 oz (88.9 kg)  Height: 5' 2"  (1.575 m)    General: Well developed, well nourished, in no acute distress  Skin : Warm and dry.  Head: Normocephalic and atraumatic  Eyes: Sclera and conjunctiva clear; pupils round and reactive to light; extraocular movements intact  Ears: External normal; canals clear; tympanic membranes normal  Oropharynx: Pink, supple. No suspicious lesions  Neck: Supple without thyromegaly, adenopathy  Lungs: Respirations unlabored; clear to auscultation bilaterally without wheeze, rales, rhonchi  CVS exam: normal rate, regular rhythm, normal S1, S2, no murmurs, rubs, clicks or gallops.  Abdomen: Soft; nontender; nondistended; normoactive bowel sounds; no masses or hepatosplenomegaly  Musculoskeletal: No deformities; no active joint inflammation  Extremities: No edema, cyanosis, clubbing  Vessels: Symmetric bilaterally  Neurologic: Alert and oriented; speech intact;  face symmetrical; moves all extremities well; CNII-XII intact without focal deficit  Pelvic exam: normal external genitalia, vulva, vagina, cervix, uterus and adnexa. Yeast infection present  Assessment:  1. PE (physical exam), annual   2. Screening mammogram, encounter for   3. Cervical cancer screening   4. Lipid screening   5. Screening for HIV (human immunodeficiency virus)     Plan:  Age  appropriate preventive healthcare needs addressed; encouraged regular eye doctor and dental exams; encouraged regular exercise and weight loss; will update labs and refills as needed today; follow-up to be determined; Thin Prep pap collected; order updated for screening mammogram; Patient defers flu shot; is not interested in quitting smoking at this time.  No follow-ups on file.  Orders Placed This Encounter  Procedures  . MM Digital Screening    Standing Status:   Future    Standing Expiration Date:   03/14/2020    Order Specific Question:   Reason for Exam (SYMPTOM  OR DIAGNOSIS REQUIRED)    Answer:   screening mammogram    Order Specific Question:   Is the patient pregnant?    Answer:   No    Order Specific Question:   Preferred imaging location?    Answer:   [facility]  . CBC w/Diff    Standing Status:   Future    Number of Occurrences:   1    Standing Expiration Date:   01/13/2020  . Comp Met (CMET)    Standing Status:   Future    Number of Occurrences:   1    Standing Expiration Date:   01/13/2020  . Lipid panel    Standing Status:   Future    Number of Occurrences:   1    Standing Expiration Date:   01/13/2020  . TSH    Standing Status:   Future    Number of Occurrences:   1    Standing Expiration Date:   01/13/2020  . Vitamin D (25 hydroxy)    Standing Status:   Future    Number of Occurrences:   1    Standing Expiration Date:   01/13/2020  . HIV Antibody (routine testing w rflx)    Standing Status:   Future    Number of Occurrences:   1    Standing Expiration Date:   01/13/2020    Requested Prescriptions   Signed Prescriptions Disp Refills  . valsartan-hydrochlorothiazide (DIOVAN-HCT) 320-25 MG tablet 90 tablet 3    Sig: Take 1 tablet by mouth daily.  Marland Kitchen escitalopram (LEXAPRO) 10 MG tablet 90 tablet 1    Sig: Take 1 tablet (10 mg total) by mouth daily.  . fluconazole (DIFLUCAN) 150 MG tablet 2 tablet 0    Sig: Take 1 tablet (150 mg total) by mouth once  for 1 dose. Repeat after 72 hours

## 2019-01-14 LAB — HIV ANTIBODY (ROUTINE TESTING W REFLEX): HIV 1&2 Ab, 4th Generation: NONREACTIVE

## 2019-02-03 ENCOUNTER — Other Ambulatory Visit: Payer: 59

## 2019-02-03 ENCOUNTER — Ambulatory Visit (INDEPENDENT_AMBULATORY_CARE_PROVIDER_SITE_OTHER): Payer: 59 | Admitting: Family

## 2019-02-03 ENCOUNTER — Other Ambulatory Visit: Payer: Self-pay

## 2019-02-03 ENCOUNTER — Other Ambulatory Visit (HOSPITAL_COMMUNITY)
Admission: RE | Admit: 2019-02-03 | Discharge: 2019-02-03 | Disposition: A | Discharge status: Home / Self Care | Payer: 59 | Source: Ambulatory Visit | Attending: Family | Admitting: Family

## 2019-02-03 ENCOUNTER — Encounter: Payer: Self-pay | Admitting: Family

## 2019-02-03 VITALS — BP 142/78 | HR 84 | Temp 98.3°F | Resp 18 | Ht 62.0 in | Wt 198.0 lb

## 2019-02-03 DIAGNOSIS — Z124 Encounter for screening for malignant neoplasm of cervix: Secondary | ICD-10-CM | POA: Diagnosis not present

## 2019-02-03 NOTE — Progress Notes (Signed)
  Angel Tucker is a 48 y.o. female with the following history as recorded in EpicCare:  Patient Active Problem List   Diagnosis Date Noted  . Morbid obesity ([place]) 11/11/2017  . 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 Tobacco dependence 11/11/2017  . Snoring 11/11/2017  . Excessive daytime sleepiness 11/11/2017  . Poor sleep hygiene 11/11/2017  . 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 Lump in neck 09/04/2014  . Essential hypertension 09/04/2014  . Encounter for screening mammogram for breast cancer 09/04/2014    Current Outpatient Medications  Medication Sig Dispense Refill  . escitalopram (LEXAPRO) 10 MG tablet Take 1 tablet (10 mg total) by mouth daily. 90 tablet 1  . valsartan-hydrochlorothiazide (DIOVAN-HCT) 320-25 MG tablet Take 1 tablet by mouth daily. 90 tablet 3   No current facility-administered medications for this visit.     Allergies: Pollen extract  Past Medical History:  Diagnosis Date  . Hypertension   . OSA (obstructive sleep apnea)     History reviewed. No pertinent surgical history.  Family History  Problem Relation Age of Onset  . Breast cancer Mother   . Hypertension Mother   . Diabetes Father   . Hypertension Father     Social History   Tobacco Use  . Smoking status: Current Every Day Smoker    Packs/day: 0.50    Years: 20.00    Pack years: 10.00    Types: Cigarettes  . Smokeless tobacco: Never Used  Substance Use Topics  . Alcohol use: No    Subjective:  Needs to have pap smear repeated- there was a lab error and pap smear done last month could not be processed; there will be no charge for visit today;    Objective:  Vitals:   02/03/19 0851  BP: (!) 142/78  Pulse: 84  Resp: 18  Temp: 98.3 F (36.8 C)  TempSrc: Oral  Weight: 198 lb (89.8 kg)  Height: 5\' 2"  (1.575 m)    General: Well developed, well nourished, in no acute distress  Skin : Warm and dry.  Lungs: Respirations unlabored;   Neurologic: Alert and oriented; speech intact; face symmetrical;  Pelvic exam: normal external genitalia, vulva, vagina, cervix    Assessment:  1. Cervical cancer screening     Plan:  Thin prep pap collected today;  No charge today;   No follow-ups on file.  No orders of the defined types were placed in this encounter.   Requested Prescriptions    No prescriptions requested or ordered in this encounter

## 2019-02-07 LAB — CYTOLOGY - PAP
Adequacy: ABSENT
Chlamydia: NEGATIVE
Comment: NEGATIVE
Comment: NEGATIVE
Comment: NEGATIVE
Comment: NORMAL
Diagnosis: NEGATIVE
High risk HPV: NEGATIVE
Neisseria Gonorrhea: NEGATIVE
Trichomonas: NEGATIVE

## 2019-03-15 ENCOUNTER — Ambulatory Visit: Payer: 59 | Admitting: Adult Health

## 2019-04-26 ENCOUNTER — Ambulatory Visit: Payer: Managed Care, Other (non HMO) | Admitting: Adult Health

## 2019-05-02 ENCOUNTER — Encounter: Payer: Self-pay | Admitting: Adult Health

## 2019-05-02 ENCOUNTER — Ambulatory Visit: Payer: Managed Care, Other (non HMO) | Admitting: Adult Health

## 2019-05-02 ENCOUNTER — Telehealth: Payer: Self-pay

## 2019-05-02 NOTE — Telephone Encounter (Signed)
Patient was a no call/no show for their appointment today.   

## 2020-02-16 ENCOUNTER — Encounter: Payer: Self-pay | Admitting: Family

## 2020-02-16 ENCOUNTER — Other Ambulatory Visit: Payer: Self-pay

## 2020-02-16 ENCOUNTER — Ambulatory Visit (INDEPENDENT_AMBULATORY_CARE_PROVIDER_SITE_OTHER): Payer: 59 | Admitting: Family

## 2020-02-16 VITALS — BP 160/94 | HR 80 | Temp 98.5°F | Ht 62.0 in | Wt 202.0 lb

## 2020-02-16 DIAGNOSIS — Z13220 Encounter for screening for lipoid disorders: Secondary | ICD-10-CM

## 2020-02-16 DIAGNOSIS — R7309 Other abnormal glucose: Secondary | ICD-10-CM | POA: Diagnosis not present

## 2020-02-16 DIAGNOSIS — I10 Essential (primary) hypertension: Secondary | ICD-10-CM

## 2020-02-16 DIAGNOSIS — F1520 Other stimulant dependence, uncomplicated: Secondary | ICD-10-CM

## 2020-02-16 LAB — COMPREHENSIVE METABOLIC PANEL
ALT: 38 U/L — ABNORMAL HIGH (ref 0–35)
AST: 46 U/L — ABNORMAL HIGH (ref 0–37)
Albumin: 3.7 g/dL (ref 3.5–5.2)
Alkaline Phosphatase: 86 U/L (ref 39–117)
BUN: 12 mg/dL (ref 6–23)
CO2: 30 mEq/L (ref 19–32)
Calcium: 8.8 mg/dL (ref 8.4–10.5)
Chloride: 102 mEq/L (ref 96–112)
Creatinine, Ser: 0.91 mg/dL (ref 0.40–1.20)
GFR: 74.51 mL/min (ref >60.00)
Glucose, Bld: 185 mg/dL — ABNORMAL HIGH (ref 70–99)
Potassium: 3.6 mEq/L (ref 3.5–5.1)
Sodium: 139 mEq/L (ref 135–145)
Total Bilirubin: 0.4 mg/dL (ref 0.2–1.2)
Total Protein: 6.9 g/dL (ref 6.0–8.3)

## 2020-02-16 LAB — CBC WITH DIFFERENTIAL/PLATELET
Basophils Absolute: 0 10*3/uL (ref 0.0–0.1)
Basophils Relative: 0.9 % (ref 0.0–3.0)
Eosinophils Absolute: 0.1 10*3/uL (ref 0.0–0.7)
Eosinophils Relative: 2.4 % (ref 0.0–5.0)
HCT: 41.2 % (ref 36.0–46.0)
Hemoglobin: 13.5 g/dL (ref 12.0–15.0)
Lymphocytes Relative: 47.7 % — ABNORMAL HIGH (ref 12.0–46.0)
Lymphs Abs: 2.5 10*3/uL (ref 0.7–4.0)
MCHC: 32.7 g/dL (ref 30.0–36.0)
MCV: 84.1 fl (ref 78.0–100.0)
Monocytes Absolute: 0.4 10*3/uL (ref 0.1–1.0)
Monocytes Relative: 8.5 % (ref 3.0–12.0)
Neutro Abs: 2.1 10*3/uL (ref 1.4–7.7)
Neutrophils Relative %: 40.5 % — ABNORMAL LOW (ref 43.0–77.0)
Platelets: 160 10*3/uL (ref 150.0–400.0)
RBC: 4.9 Mil/uL (ref 3.87–5.11)
RDW: 14.7 % (ref 11.5–15.5)
WBC: 5.3 10*3/uL (ref 4.0–10.5)

## 2020-02-16 LAB — LIPID PANEL
Cholesterol: 152 mg/dL (ref 0–200)
HDL: 41.4 mg/dL (ref >39.00)
LDL Cholesterol: 87 mg/dL (ref 0–99)
NonHDL: 110.77
Total CHOL/HDL Ratio: 4
Triglycerides: 121 mg/dL (ref 0.0–149.0)
VLDL: 24.2 mg/dL (ref 0.0–40.0)

## 2020-02-16 LAB — HEMOGLOBIN A1C: Hgb A1c MFr Bld: 6.4 % (ref 4.6–6.5)

## 2020-02-16 MED ORDER — VALSARTAN-HYDROCHLOROTHIAZIDE 320-25 MG PO TABS: 1.0000 | ORAL_TABLET | Freq: Every day | ORAL | 1 refills | Status: DC

## 2020-02-16 NOTE — Addendum Note (Signed)
Addended by: Jacob Moores on: 02/16/2020 10:21 AM   Modules accepted: Orders

## 2020-02-16 NOTE — Progress Notes (Signed)
Angel Tucker is a 49 y.o. female with the following history as recorded in EpicCare:  Patient Active Problem List   Diagnosis Date Noted  . Morbid obesity ([place]) 11/11/2017  . 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 Tobacco dependence 11/11/2017  . Snoring 11/11/2017  . Excessive daytime sleepiness 11/11/2017  . Poor sleep hygiene 11/11/2017  . 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 Lump in neck 09/04/2014  . Essential hypertension 09/04/2014  . Encounter for screening mammogram for breast cancer 09/04/2014    Current Outpatient Medications  Medication Sig Dispense Refill  . valsartan-hydrochlorothiazide (DIOVAN-HCT) 320-25 MG tablet Take 1 tablet by mouth daily. 90 tablet 1   No current facility-administered medications for this visit.    Allergies: Pollen extract  Past Medical History:  Diagnosis Date  . Hypertension   . OSA (obstructive sleep apnea)     History reviewed. No pertinent surgical history.  Family History  Problem Relation Age of Onset  . Breast cancer Mother   . Hypertension Mother   . Diabetes Father   . Hypertension Father     Social History   Tobacco Use  . Smoking status: Current Every Day Smoker    Packs/day: 0.50    Years: 20.00    Pack years: 10.00    Types: Cigarettes  . Smokeless tobacco: Never Used  Substance Use Topics  . Alcohol use: No    Subjective:   Presents for follow up on hypertension; readily admits that she is not taking medication regularly- "I forget to take it." Denies any chest pain, shortness of breath, blurred vision or headache. Is not wearing her CPAP at this time; is not exercising regularly;      Objective:  Vitals:   02/16/20 0954  BP: (!) 160/94  Pulse: 80  Temp: 98.5 F (36.9 C)  TempSrc: Oral  SpO2: 99%  Weight: 202 lb (91.6 kg)  Height: 5' 2"  (1.575 m)    General: Well developed, well nourished, in no acute distress  Skin : Warm and dry.  Head: Normocephalic  and atraumatic  Lungs: Respirations unlabored; clear to auscultation bilaterally without wheeze, rales, rhonchi  CVS exam: normal rate and regular rhythm.  Neurologic: Alert and oriented; speech intact; face symmetrical; moves all extremities well; CNII-XII intact without focal deficit   Assessment:  1. Essential hypertension   2. Lipid screening   3. Elevated glucose   4. Caffeine dependence, continuous ([name]) Chronic  5. Morbid obesity ([place]) Chronic    Plan:  1. Stressed need to take medication daily as prescribed; encouraged to set time to help her remember; discussed need for weight loss; refill updated; 2. Check lipid panel today; 3. Check Hgba1c today; limit intake of refined sugars;   This visit occurred during the SARS-CoV-2 public health emergency.  Safety protocols were in place, including screening questions prior to the visit, additional usage of staff PPE, and extensive cleaning of exam room while observing appropriate contact time as indicated for disinfecting solutions.      Return in about 2 months (around 04/17/2020) for Please set a timer to help you remember to take your medication.  Orders Placed This Encounter  Procedures  . CBC with Differential/Platelet    Standing Status:   Future    Standing Expiration Date:   02/15/2021  . Comp Met (CMET)    Standing Status:   Future    Standing Expiration Date:   02/15/2021  . Lipid panel    Standing  Status:   Future    Standing Expiration Date:   02/15/2021  . Hemoglobin A1c    Standing Status:   Future    Standing Expiration Date:   02/15/2021    Requested Prescriptions   Signed Prescriptions Disp Refills  . valsartan-hydrochlorothiazide (DIOVAN-HCT) 320-25 MG tablet 90 tablet 1    Sig: Take 1 tablet by mouth daily.

## 2020-04-18 ENCOUNTER — Telehealth: Payer: Self-pay | Admitting: Family

## 2020-04-18 ENCOUNTER — Telehealth: Payer: Self-pay

## 2020-04-18 NOTE — Telephone Encounter (Signed)
Patient is requesting a call back about her lab results from on 02/16/2020.

## 2020-04-18 NOTE — Telephone Encounter (Signed)
Results given from 02/16/20

## 2020-04-19 ENCOUNTER — Ambulatory Visit: Payer: 59 | Admitting: Family

## 2020-05-03 ENCOUNTER — Ambulatory Visit: Payer: 59 | Admitting: Family

## 2020-05-10 ENCOUNTER — Other Ambulatory Visit: Payer: Self-pay

## 2020-05-10 ENCOUNTER — Ambulatory Visit (INDEPENDENT_AMBULATORY_CARE_PROVIDER_SITE_OTHER)
Admission: RE | Admit: 2020-05-10 | Discharge: 2020-05-10 | Disposition: A | Discharge status: Home / Self Care | Payer: 59 | Source: Ambulatory Visit | Attending: Family | Admitting: Family

## 2020-05-10 ENCOUNTER — Other Ambulatory Visit: Payer: Self-pay | Admitting: Family

## 2020-05-10 ENCOUNTER — Encounter: Payer: Self-pay | Admitting: Family

## 2020-05-10 ENCOUNTER — Ambulatory Visit (INDEPENDENT_AMBULATORY_CARE_PROVIDER_SITE_OTHER): Payer: 59 | Admitting: Family

## 2020-05-10 ENCOUNTER — Other Ambulatory Visit (INDEPENDENT_AMBULATORY_CARE_PROVIDER_SITE_OTHER): Payer: 59

## 2020-05-10 VITALS — BP 136/84 | HR 97 | Temp 98.4°F | Ht 62.0 in | Wt 201.0 lb

## 2020-05-10 DIAGNOSIS — R109 Unspecified abdominal pain: Secondary | ICD-10-CM

## 2020-05-10 DIAGNOSIS — D259 Leiomyoma of uterus, unspecified: Secondary | ICD-10-CM

## 2020-05-10 DIAGNOSIS — R1031 Right lower quadrant pain: Secondary | ICD-10-CM

## 2020-05-10 DIAGNOSIS — R7989 Other specified abnormal findings of blood chemistry: Secondary | ICD-10-CM | POA: Diagnosis not present

## 2020-05-10 LAB — COMPREHENSIVE METABOLIC PANEL
ALT: 23 U/L (ref 0–35)
AST: 31 U/L (ref 0–37)
Albumin: 3.6 g/dL (ref 3.5–5.2)
Alkaline Phosphatase: 70 U/L (ref 39–117)
BUN: 9 mg/dL (ref 6–23)
CO2: 27 mEq/L (ref 19–32)
Calcium: 9.1 mg/dL (ref 8.4–10.5)
Chloride: 100 mEq/L (ref 96–112)
Creatinine, Ser: 0.78 mg/dL (ref 0.40–1.20)
GFR: 89.5 mL/min (ref >60.00)
Glucose, Bld: 132 mg/dL — ABNORMAL HIGH (ref 70–99)
Potassium: 3.4 mEq/L — ABNORMAL LOW (ref 3.5–5.1)
Sodium: 137 mEq/L (ref 135–145)
Total Bilirubin: 0.9 mg/dL (ref 0.2–1.2)
Total Protein: 7.7 g/dL (ref 6.0–8.3)

## 2020-05-10 LAB — CBC WITH DIFFERENTIAL/PLATELET
Basophils Absolute: 0 10*3/uL (ref 0.0–0.1)
Basophils Relative: 0.4 % (ref 0.0–3.0)
Eosinophils Absolute: 0 10*3/uL (ref 0.0–0.7)
Eosinophils Relative: 0.6 % (ref 0.0–5.0)
HCT: 41.1 % (ref 36.0–46.0)
Hemoglobin: 13.7 g/dL (ref 12.0–15.0)
Lymphocytes Relative: 20.4 % (ref 12.0–46.0)
Lymphs Abs: 1.6 10*3/uL (ref 0.7–4.0)
MCHC: 33.5 g/dL (ref 30.0–36.0)
MCV: 83.1 fl (ref 78.0–100.0)
Monocytes Absolute: 0.8 10*3/uL (ref 0.1–1.0)
Monocytes Relative: 9.6 % (ref 3.0–12.0)
Neutro Abs: 5.4 10*3/uL (ref 1.4–7.7)
Neutrophils Relative %: 69 % (ref 43.0–77.0)
Platelets: 165 10*3/uL (ref 150.0–400.0)
RBC: 4.94 Mil/uL (ref 3.87–5.11)
RDW: 13.2 % (ref 11.5–15.5)
WBC: 7.8 10*3/uL (ref 4.0–10.5)

## 2020-05-10 LAB — HEPATIC FUNCTION PANEL
ALT: 22 U/L (ref 0–35)
AST: 32 U/L (ref 0–37)
Albumin: 3.6 g/dL (ref 3.5–5.2)
Alkaline Phosphatase: 70 U/L (ref 39–117)
Bilirubin, Direct: 0.3 mg/dL (ref 0.0–0.3)
Total Bilirubin: 0.9 mg/dL (ref 0.2–1.2)
Total Protein: 7.7 g/dL (ref 6.0–8.3)

## 2020-05-10 MED ORDER — IOHEXOL 300 MG/ML  SOLN
100.0000 mL | Freq: Once | INTRAMUSCULAR | Status: AC | PRN
  Administered 2020-05-10: 100 mL via INTRAVENOUS

## 2020-05-10 MED ORDER — VALSARTAN-HYDROCHLOROTHIAZIDE 320-25 MG PO TABS: 1.0000 | ORAL_TABLET | Freq: Every day | ORAL | 1 refills | Status: DC

## 2020-05-10 MED ORDER — IBUPROFEN 800 MG PO TABS: 800.0000 mg | ORAL_TABLET | Freq: Three times a day (TID) | ORAL | 0 refills | Status: DC | PRN

## 2020-05-10 NOTE — Progress Notes (Signed)
Angel Tucker is a 50 y.o. female with the following history as recorded in EpicCare:  Patient Active Problem List   Diagnosis Date Noted  . Morbid obesity ([place]) 11/11/2017  . 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 Tobacco dependence 11/11/2017  . Snoring 11/11/2017  . Excessive daytime sleepiness 11/11/2017  . Poor sleep hygiene 11/11/2017  . 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 Lump in neck 09/04/2014  . Essential hypertension 09/04/2014  . Encounter for screening mammogram for breast cancer 09/04/2014    Current Outpatient Medications  Medication Sig Dispense Refill  . valsartan-hydrochlorothiazide (DIOVAN-HCT) 320-25 MG tablet Take 1 tablet by mouth daily. 90 tablet 1   No current facility-administered medications for this visit.    Allergies: Pollen extract  Past Medical History:  Diagnosis Date  . Hypertension   . OSA (obstructive sleep apnea)     No past surgical history on file.  Family History  Problem Relation Age of Onset  . Breast cancer Mother   . Hypertension Mother   . Diabetes Father   . Hypertension Father     Social History   Tobacco Use  . Smoking status: Current Every Day Smoker    Packs/day: 0.50    Years: 20.00    Pack years: 10.00    Types: Cigarettes  . Smokeless tobacco: Never Used  Substance Use Topics  . Alcohol use: No    Subjective:   Patient was originally scheduled for a blood pressure follow-up today; notes however she has been having RLQ pain for the past 4 days- night sweats/ feels like she can barely stand up; no vomiting but has had some nausea; still has appendix;     Objective:  Vitals:   05/10/20 0842  BP: 136/84  Pulse: 97  Temp: 98.4 F (36.9 C)  TempSrc: Oral  SpO2: 99%  Weight: 201 lb (91.2 kg)  Height: 5' 2"  (1.575 m)    General: Well developed, well nourished, in no acute distress  Skin : Warm and dry.  Head: Normocephalic and atraumatic  Lungs: Respirations  unlabored; clear to auscultation bilaterally without wheeze, rales, rhonchi  Abdomen: Soft; tender to palpation over RLQ; nondistended; normoactive bowel sounds; no masses or hepatosplenomegaly  Musculoskeletal: No deformities; no active joint inflammation  Extremities: No edema, cyanosis, clubbing  Vessels: Symmetric bilaterally  Neurologic: Alert and oriented; speech intact; face symmetrical; moves all extremities well; CNII-XII intact without focal deficit   Assessment:  1. Abdominal pain, unspecified abdominal location   2. Elevated LFTs   3. RLQ abdominal pain     Plan:  ? Appendicitis vs diverticulitis; will check STAT CBC, CMP today; check abd/ pelvic CT; follow-up to be determined;  This visit occurred during the SARS-CoV-2 public health emergency.  Safety protocols were in place, including screening questions prior to the visit, additional usage of staff PPE, and extensive cleaning of exam room while observing appropriate contact time as indicated for disinfecting solutions.     No follow-ups on file.  Orders Placed This Encounter  Procedures  . Urine Culture    Standing Status:   Future    Standing Expiration Date:   05/10/2021  . CT Abdomen Pelvis W Contrast    Standing Status:   Future    Standing Expiration Date:   05/10/2021    Order Specific Question:   If indicated for the ordered procedure, I authorize the administration of contrast media per Radiology protocol    Answer:   Yes  Order Specific Question:   Is patient pregnant?    Answer:   No    Order Specific Question:   Preferred imaging location?    Answer:   [id number]    Order Specific Question:   Is Oral Contrast requested for this exam?    Answer:   Yes, Per Radiology protocol  . Hepatic function panel    Standing Status:   Future    Standing Expiration Date:   05/10/2021  . CBC with Differential/Platelet    Standing Status:   Future    Standing Expiration Date:   05/10/2021  . Comp Met (CMET)     Standing Status:   Future    Standing Expiration Date:   05/10/2021    Requested Prescriptions   Signed Prescriptions Disp Refills  . valsartan-hydrochlorothiazide (DIOVAN-HCT) 320-25 MG tablet 90 tablet 1    Sig: Take 1 tablet by mouth daily.

## 2020-05-11 LAB — URINE CULTURE
MICRO NUMBER:: 11524598
Result:: NO GROWTH
SPECIMEN QUALITY:: ADEQUATE

## 2020-05-13 ENCOUNTER — Telehealth: Payer: Self-pay | Admitting: Family

## 2020-05-13 NOTE — Telephone Encounter (Signed)
Patient calling states she would like to speak to someone about the CT scan results that came back on friday

## 2020-05-13 NOTE — Telephone Encounter (Signed)
Patient called again and said that it was urgent if someone could call her back in the next hour. She can  Be reached at [phone].

## 2020-05-14 NOTE — Telephone Encounter (Signed)
Pt states she is a great deal of pain & has been taking the Ibuprofen q8hrs while awake. She is requesting the referral be urgent for this reason. Checked with referral team, who states they will work on calling offices today.

## 2020-05-15 DIAGNOSIS — D259 Leiomyoma of uterus, unspecified: Secondary | ICD-10-CM

## 2020-05-15 DIAGNOSIS — J45909 Unspecified asthma, uncomplicated: Secondary | ICD-10-CM

## 2020-05-15 HISTORY — DX: Unspecified asthma, uncomplicated: J45.909

## 2020-05-15 HISTORY — DX: Leiomyoma of uterus, unspecified: D25.9

## 2020-05-20 ENCOUNTER — Encounter: Payer: Self-pay | Admitting: Family

## 2020-05-21 ENCOUNTER — Other Ambulatory Visit: Payer: Self-pay | Admitting: Family

## 2020-05-21 ENCOUNTER — Telehealth: Payer: Self-pay

## 2020-05-21 ENCOUNTER — Ambulatory Visit: Payer: Managed Care, Other (non HMO) | Admitting: Adult Health

## 2020-05-21 DIAGNOSIS — R2689 Other abnormalities of gait and mobility: Secondary | ICD-10-CM

## 2020-05-21 DIAGNOSIS — R42 Dizziness and giddiness: Secondary | ICD-10-CM

## 2020-05-21 NOTE — Telephone Encounter (Signed)
Called and spoke to pt, She is no longer using cpap, Pt wanted to be seen for new problem, unexplained dizziness, Informed her that our APPs do see new problem visits, pt will need to be evaluated by PCP then a referral can be made if needed. Appt today canceled.  Pt voiced understanding all questions answered

## 2020-05-28 ENCOUNTER — Telehealth: Payer: Self-pay | Admitting: Family

## 2020-05-28 NOTE — Telephone Encounter (Signed)
LVM for patient to inform. She will need an appointment with Mickel Baas to discuss this request. If she does have a Psychologist, sport and exercise and has an upcoming surgery. They would be the ones to complete these forms.

## 2020-05-28 NOTE — Telephone Encounter (Signed)
Patient calling today to request assistance with ST[employer] claim She states she has been in extreme pain since 2/11 visit Requesting to be out work from 02/14 to 3/28 (surgery date 3/28) She says is unable to sit for 8 hours a day working.  Boris Lown # [id number] [phone] Phone [phone] Fax [phone] Claim worker [place]

## 2020-05-29 ENCOUNTER — Telehealth (INDEPENDENT_AMBULATORY_CARE_PROVIDER_SITE_OTHER): Payer: 59 | Admitting: Family

## 2020-05-29 DIAGNOSIS — D259 Leiomyoma of uterus, unspecified: Secondary | ICD-10-CM | POA: Diagnosis not present

## 2020-05-29 NOTE — Progress Notes (Signed)
Angel Tucker is a 50 y.o. female with the following history as recorded in EpicCare:  Patient Active Problem List   Diagnosis Date Noted  . Morbid obesity ([place]) 11/11/2017  . 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 Tobacco dependence 11/11/2017  . Snoring 11/11/2017  . Excessive daytime sleepiness 11/11/2017  . Poor sleep hygiene 11/11/2017  . 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 Lump in neck 09/04/2014  . Essential hypertension 09/04/2014  . Encounter for screening mammogram for breast cancer 09/04/2014    Current Outpatient Medications  Medication Sig Dispense Refill  . ibuprofen (ADVIL) 800 MG tablet Take 1 tablet (800 mg total) by mouth every 8 (eight) hours as needed. 60 tablet 0  . traMADol (ULTRAM) 50 MG tablet Take 50 mg by mouth every 6 (six) hours as needed.    . valsartan-hydrochlorothiazide (DIOVAN-HCT) 320-25 MG tablet Take 1 tablet by mouth daily. 90 tablet 1   No current facility-administered medications for this visit.    Allergies: Pollen extract  Past Medical History:  Diagnosis Date  . Hypertension   . OSA (obstructive sleep apnea)     No past surgical history on file.  Family History  Problem Relation Age of Onset  . Breast cancer Mother   . Hypertension Mother   . Diabetes Father   . Hypertension Father     Social History   Tobacco Use  . Smoking status: Current Every Day Smoker    Packs/day: 0.50    Years: 20.00    Pack years: 10.00    Types: Cigarettes  . Smokeless tobacco: Never Used  Substance Use Topics  . Alcohol use: No    Subjective:     I connected with Angel Tucker on 05/29/20 at 11:20 AM EST by a video enabled telemedicine application and verified that I am speaking with the correct person using two identifiers.   I discussed the limitations of evaluation and management by telemedicine and the availability of in person appointments. The patient expressed understanding and agreed to  proceed. Provider in office/ patient is at home; provider and patient are only 2 people on video call.   Patient was last seen in the office on Friday, May 10, 2020 with abdominal pain; imaging done that day showed extensive uterine fibroids; she has since been seen by GYN and is scheduled for total hysterectomy at the end of this month;  She is complaining of inadequate pain management on combination of Ibuprofen and Tramadol; she notes the pain is keeping her from being able to perform her job and is asking for STD paperwork to be completed from 2/11- through time of her surgery at the end of March.     Objective:  There were no vitals filed for this visit.  General: Well developed, well nourished, in no acute distress  Head: Normocephalic and atraumatic  Lungs: Respirations unlabored; Neurologic: Alert and oriented; speech intact; face symmetrical;   Assessment:  1. Uterine leiomyoma, unspecified location     Plan:  Re-iterated to patient that she needs to reach out to her GYN to let that office know about uncontrolled pain on current regimen. Explained that in general, primary care would not manage her short term disability since the fibroids are the issue and that is being managed by GYN. Patient has called our office in the past 2 weeks but that was only to ask for neurology referral. With that phone call, she did not mention anything about wanting to  stay out of work for the 6 weeks prior to her hysterectomy. She is asked to try and see her GYN as soon as possible in the office to discuss her concerns and see if date of surgery can be moved up for her.     No follow-ups on file.  No orders of the defined types were placed in this encounter.   Requested Prescriptions    No prescriptions requested or ordered in this encounter

## 2020-06-18 ENCOUNTER — Other Ambulatory Visit: Payer: Self-pay

## 2020-06-18 ENCOUNTER — Encounter (HOSPITAL_BASED_OUTPATIENT_CLINIC_OR_DEPARTMENT_OTHER): Payer: Self-pay | Admitting: Obstetrics and Gynecology

## 2020-06-18 NOTE — Progress Notes (Signed)
YOU ARE SCHEDULED FOR A COVID TEST 05-23-2020 AT  29 PM . THIS TEST MUST BE DONE BEFORE SURGERY. GO TO  [facility]. [place], [place], IT IS APPROXIMATELY 2 MINUTES PAST [employer] ON THE RIGHT AND REMAIN IN YOUR CAR, THIS IS A DRIVE UP TEST. ONCE YOUR COVID TEST IS DONE PLEASE FOLLOW ALL THE QUARANTINE  INSTRUCTIONS GIVEN IN YOUR HANDOUT.      Your procedure is scheduled on  06-24-2020  Report to [facility]   Call this number if you have problems the morning of surgery  [phone].   OUR ADDRESS IS [address].  WE ARE LOCATED IN [facility].  PLEASE BRING YOUR INSURANCE CARD AND PHOTO ID DAY OF SURGERY.  ONLY ONE PERSON ALLOWED IN FACILITY WAITING AREA.                                     REMEMBER:  DO NOT EAT FOOD, CANDY GUM OR MINTS  AFTER MIDNIGHT . YOU MAY HAVE CLEAR LIQUIDS FROM MIDNIGHT UNTIL 430 AM. NO CLEAR LIQUIDS AFTER 430 AM DAY OF SURGERY.   YOU MAY  BRUSH YOUR TEETH MORNING OF SURGERY AND RINSE YOUR MOUTH OUT, NO CHEWING GUM CANDY OR MINTS.    CLEAR LIQUID DIET   Foods Allowed                                                                     Foods Excluded  Coffee and tea, regular and decaf                             liquids that you cannot  Plain Jell-O any favor except red or purple                                           see through such as: Fruit ices (not with fruit pulp)                                     milk, soups, orange juice  Iced Popsicles                                    All solid food Carbonated beverages, regular and diet                                    Cranberry, grape and apple juices Sports drinks like Gatorade Lightly seasoned clear broth or consume(fat free) Sugar, honey syrup  Sample Menu Breakfast                                Lunch  Supper Cranberry juice                    Beef broth                            Chicken  broth Jell-O                                     Grape juice                           Apple juice Coffee or tea                        Jell-O                                      Popsicle                                                Coffee or tea                        Coffee or tea  _____________________________________________________________________     TAKE THESE MEDICATIONS MORNING OF SURGERY WITH A SIP OF WATER: NONE.  ONE VISITOR IS ALLOWED IN WAITING ROOM ONLY DAY OF SURGERY.  NO VISITOR MAY SPEND THE NIGHT.  VISITOR ARE ALLOWED TO STAY UNTIL 800 PM.  PLEASE ARRANGE FOR YOUR DRIVER TO PICK YOU UP AT 900 AM DAY AFTER SURGERY IF YOU SPEND THE NIGHT.                                    DO NOT WEAR JEWERLY, MAKE UP. DO NOT WEAR LOTIONS, POWDERS, PERFUMES OR DEODORANT. DO NOT SHAVE FOR 24 HOURS PRIOR TO DAY OF SURGERY. MEN MAY SHAVE FACE AND NECK. CONTACTS, GLASSES, OR DENTURES MAY NOT BE WORN TO SURGERY.                                    [facility] IS NOT RESPONSIBLE  FOR ANY BELONGINGS.                                                                    Marland Kitchen           [facility] - Preparing for Surgery Before surgery, you can play an important role.  Because skin is not sterile, your skin needs to be as free of germs as possible.  You can reduce the number of germs on your skin by washing with CHG (chlorahexidine gluconate) soap before surgery.  CHG is an antiseptic cleaner which kills germs and bonds with the skin to continue killing germs even after washing. Please  DO NOT use if you have an allergy to CHG or antibacterial soaps.  If your skin becomes reddened/irritated stop using the CHG and inform your nurse when you arrive at Short Stay. Do not shave (including legs and underarms) for at least 48 hours prior to the first CHG shower.  You may shave your face/neck. Please follow these instructions carefully:  1.  Shower with CHG Soap the night before surgery and the  morning of  Surgery.  2.  If you choose to wash your hair, wash your hair first as usual with your  normal  shampoo.  3.  After you shampoo, rinse your hair and body thoroughly to remove the  shampoo.                            4.  Use CHG as you would any other liquid soap.  You can apply chg directly  to the skin and wash                      Gently with a scrungie or clean washcloth.  5.  Apply the CHG Soap to your body ONLY FROM THE NECK DOWN.   Do not use on face/ open                           Wound or open sores. Avoid contact with eyes, ears mouth and genitals (private parts).                       Wash face,  Genitals (private parts) with your normal soap.             6.  Wash thoroughly, paying special attention to the area where your surgery  will be performed.  7.  Thoroughly rinse your body with warm water from the neck down.  8.  DO NOT shower/wash with your normal soap after using and rinsing off  the CHG Soap.                9.  Pat yourself dry with a clean towel.            10.  Wear clean pajamas.            11.  Place clean sheets on your bed the night of your first shower and do not  sleep with pets. Day of Surgery : Do not apply any lotions/deodorants the morning of surgery.  Please wear clean clothes to the hospital/surgery center.  FAILURE TO FOLLOW THESE INSTRUCTIONS MAY RESULT IN THE CANCELLATION OF YOUR SURGERY PATIENT SIGNATURE_________________________________  NURSE SIGNATURE__________________________________  ________________________________________________________________________                                                        QUESTIONS Hansel Feinstein PRE OP NURSE PHONE [phone]

## 2020-06-18 NOTE — Progress Notes (Addendum)
Spoke w/ via phone for pre-op interview---PT Lab needs dos----URINE PREG DOS     HAS LAB APPT 06-20-2020 1100 FOR CBC BMP EKG T & S            Lab results------none COVID test ------06-20-2020 1230 Arrive at -------530 am 06-24-2020 NPO after MN NO Solid Food.  Clear liquids from MN until---430 am then npo Med rec completed Medications to take morning of surgery -----none Diabetic medication -----n/a Patient instructed to bring photo id and insurance card day of surgery Patient aware to have Driver (ride ) / Oncologist after ower daughter or brother   for 24 hours after surgery  Patient Special Instructions -----pt given overnight stay instructions Pre-Op special Istructions -----none Patient verbalized understanding of instructions that were given at this phone interview. Patient denies shortness of breath, chest pain, fever, cough at this phone interview.  Pt  willl remove nose piercing and put plastic insert in

## 2020-06-20 ENCOUNTER — Other Ambulatory Visit (HOSPITAL_COMMUNITY)
Admission: RE | Admit: 2020-06-20 | Discharge: 2020-06-20 | Disposition: A | Discharge status: Home / Self Care | Payer: 59 | Source: Ambulatory Visit | Attending: Obstetrics and Gynecology | Admitting: Obstetrics and Gynecology

## 2020-06-20 ENCOUNTER — Encounter (HOSPITAL_COMMUNITY)
Admission: RE | Admit: 2020-06-20 | Discharge: 2020-06-20 | Disposition: A | Discharge status: Home / Self Care | Payer: 59 | Source: Ambulatory Visit | Attending: Obstetrics and Gynecology | Admitting: Obstetrics and Gynecology

## 2020-06-20 ENCOUNTER — Other Ambulatory Visit: Payer: Self-pay

## 2020-06-20 DIAGNOSIS — Z01818 Encounter for other preprocedural examination: Secondary | ICD-10-CM | POA: Diagnosis present

## 2020-06-20 DIAGNOSIS — Z20822 Contact with and (suspected) exposure to covid-19: Secondary | ICD-10-CM | POA: Insufficient documentation

## 2020-06-20 DIAGNOSIS — Z01812 Encounter for preprocedural laboratory examination: Secondary | ICD-10-CM | POA: Insufficient documentation

## 2020-06-20 LAB — BASIC METABOLIC PANEL
Anion gap: 9 (ref 5–15)
BUN: 12 mg/dL (ref 6–20)
CO2: 25 mmol/L (ref 22–32)
Calcium: 8.4 mg/dL — ABNORMAL LOW (ref 8.9–10.3)
Chloride: 103 mmol/L (ref 98–111)
Creatinine, Ser: 0.85 mg/dL (ref 0.44–1.00)
GFR, Estimated: >60 mL/min (ref >60)
Glucose, Bld: 178 mg/dL — ABNORMAL HIGH (ref 70–99)
Potassium: 3.2 mmol/L — ABNORMAL LOW (ref 3.5–5.1)
Sodium: 137 mmol/L (ref 135–145)

## 2020-06-20 LAB — CBC
HCT: 39.8 % (ref 36.0–46.0)
Hemoglobin: 12.9 g/dL (ref 12.0–15.0)
MCH: 27.9 pg (ref 26.0–34.0)
MCHC: 32.4 g/dL (ref 30.0–36.0)
MCV: 86.1 fL (ref 80.0–100.0)
Platelets: 162 10*3/uL (ref 150–400)
RBC: 4.62 MIL/uL (ref 3.87–5.11)
RDW: 13.8 % (ref 11.5–15.5)
WBC: 5.4 10*3/uL (ref 4.0–10.5)
nRBC: 0 % (ref 0.0–0.2)

## 2020-06-20 LAB — SARS CORONAVIRUS 2 (TAT 6-24 HRS): SARS Coronavirus 2: NEGATIVE

## 2020-06-22 ENCOUNTER — Ambulatory Visit (HOSPITAL_BASED_OUTPATIENT_CLINIC_OR_DEPARTMENT_OTHER): Payer: 59 | Admitting: Anesthesiology

## 2020-06-22 NOTE — Anesthesia Preprocedure Evaluation (Addendum)
Anesthesia Evaluation  Patient identified by MRN, date of birth, ID band Patient awake    Reviewed: Allergy & Precautions, Patient's Chart, lab work & pertinent test results  Airway Mallampati: II  TM Distance: >3 FB     Dental   Pulmonary sleep apnea and Continuous Positive Airway Pressure Ventilation , Current Smoker,    breath sounds clear to auscultation       Cardiovascular hypertension, Pt. on medications  Rhythm:Regular Rate:Normal     Neuro/Psych  Headaches, Anxiety Depression    GI/Hepatic negative GI ROS, Neg liver ROS,   Endo/Other  negative endocrine ROS  Renal/GU negative Renal ROSK+ 3.2     Musculoskeletal   Abdominal (+) + obese,   Peds  Hematology Hgb 12.9   Anesthesia Other Findings   Reproductive/Obstetrics                            Anesthesia Physical Anesthesia Plan  ASA: III  Anesthesia Plan: General   Post-op Pain Management:    Induction: Intravenous  PONV Risk Score and Plan: 3 and Treatment may vary due to age or medical condition, Ondansetron, Midazolam and Dexamethasone  Airway Management Planned: Oral ETT  Additional Equipment: None  Intra-op Plan:   Post-operative Plan: Extubation in OR  Informed Consent:     Dental advisory given  Plan Discussed with: CRNA and Anesthesiologist  Anesthesia Plan Comments: (GA w Lidocaine infusion + ketamine 0.5 mg/ kg)        Anesthesia Quick Evaluation
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
## 2020-06-24 ENCOUNTER — Encounter (HOSPITAL_BASED_OUTPATIENT_CLINIC_OR_DEPARTMENT_OTHER): Payer: Self-pay | Admitting: Obstetrics and Gynecology

## 2020-06-24 ENCOUNTER — Other Ambulatory Visit: Payer: Self-pay

## 2020-06-24 ENCOUNTER — Ambulatory Visit (HOSPITAL_BASED_OUTPATIENT_CLINIC_OR_DEPARTMENT_OTHER)
Admission: RE | Admit: 2020-06-24 | Discharge: 2020-06-24 | Disposition: A | Discharge status: Home / Self Care | Payer: 59 | Attending: Obstetrics and Gynecology | Admitting: Obstetrics and Gynecology

## 2020-06-24 ENCOUNTER — Encounter (HOSPITAL_BASED_OUTPATIENT_CLINIC_OR_DEPARTMENT_OTHER)
Admission: RE | Disposition: A | Discharge status: Home / Self Care | Payer: Self-pay | Source: Home / Self Care | Attending: Obstetrics and Gynecology

## 2020-06-24 DIAGNOSIS — G8929 Other chronic pain: Secondary | ICD-10-CM | POA: Insufficient documentation

## 2020-06-24 DIAGNOSIS — R102 Pelvic and perineal pain: Secondary | ICD-10-CM | POA: Insufficient documentation

## 2020-06-24 DIAGNOSIS — Z5309 Procedure and treatment not carried out because of other contraindication: Secondary | ICD-10-CM | POA: Diagnosis not present

## 2020-06-24 HISTORY — DX: Benign neoplasm of connective and other soft tissue, unspecified: D21.9

## 2020-06-24 HISTORY — DX: Depression, unspecified: F32.A

## 2020-06-24 HISTORY — DX: Anxiety disorder, unspecified: F41.9

## 2020-06-24 HISTORY — DX: Presence of spectacles and contact lenses: Z97.3

## 2020-06-24 HISTORY — DX: Migraine, unspecified, not intractable, without status migrainosus: G43.909

## 2020-06-24 HISTORY — DX: Dorsalgia, unspecified: M54.9

## 2020-06-24 LAB — POCT PREGNANCY, URINE: Preg Test, Ur: NEGATIVE

## 2020-06-24 LAB — TYPE AND SCREEN
ABO/RH(D): O POS
Antibody Screen: NEGATIVE

## 2020-06-24 LAB — ABO/RH: ABO/RH(D): O POS

## 2020-06-24 LAB — GLUCOSE, CAPILLARY: Glucose-Capillary: 93 mg/dL (ref 70–99)

## 2020-06-24 SURGERY — CANCELLED PROCEDURE
Anesthesia: General | Site: Abdomen | Laterality: Bilateral

## 2020-06-24 MED ORDER — CEFAZOLIN SODIUM-DEXTROSE 2-4 GM/100ML-% IV SOLN
INTRAVENOUS | Status: AC
  Filled 2020-06-24: qty 100

## 2020-06-24 MED ORDER — LIDOCAINE 2% (20 MG/ML) 5 ML SYRINGE
INTRAMUSCULAR | Status: AC
  Filled 2020-06-24: qty 5

## 2020-06-24 MED ORDER — DEXAMETHASONE SODIUM PHOSPHATE 10 MG/ML IJ SOLN
INTRAMUSCULAR | Status: AC
  Filled 2020-06-24: qty 1

## 2020-06-24 MED ORDER — ROCURONIUM BROMIDE 10 MG/ML (PF) SYRINGE
PREFILLED_SYRINGE | INTRAVENOUS | Status: AC
  Filled 2020-06-24: qty 10

## 2020-06-24 MED ORDER — PROPOFOL 10 MG/ML IV BOLUS
INTRAVENOUS | Status: AC
  Filled 2020-06-24: qty 40

## 2020-06-24 MED ORDER — MIDAZOLAM HCL 2 MG/2ML IJ SOLN
INTRAMUSCULAR | Status: AC
  Filled 2020-06-24: qty 2

## 2020-06-24 MED ORDER — POVIDONE-IODINE 10 % EX SWAB
2.0000 "application " | Freq: Once | CUTANEOUS | Status: AC
  Administered 2020-06-24: 2 via TOPICAL

## 2020-06-24 MED ORDER — KETAMINE HCL 50 MG/5ML IJ SOSY
PREFILLED_SYRINGE | INTRAMUSCULAR | Status: AC
  Filled 2020-06-24: qty 5

## 2020-06-24 MED ORDER — CEFAZOLIN SODIUM-DEXTROSE 2-4 GM/100ML-% IV SOLN: 2.0000 g | INTRAVENOUS | Status: DC

## 2020-06-24 MED ORDER — FENTANYL CITRATE (PF) 250 MCG/5ML IJ SOLN
INTRAMUSCULAR | Status: AC
  Filled 2020-06-24: qty 5

## 2020-06-24 MED ORDER — LACTATED RINGERS IV SOLN: INTRAVENOUS | Status: DC

## 2020-06-24 SURGICAL SUPPLY — 32 items
APL SKNCLS STERI-STRIP NONHPOA (GAUZE/BANDAGES/DRESSINGS)
BENZOIN TINCTURE PRP APPL 2/3 (GAUZE/BANDAGES/DRESSINGS) IMPLANT
CANISTER SUCT 1200ML W/VALVE (MISCELLANEOUS) ×1 IMPLANT
COVER WAND RF STERILE (DRAPES) ×1 IMPLANT
DECANTER SPIKE VIAL GLASS SM (MISCELLANEOUS) ×2 IMPLANT
DRAPE WARM FLUID 44X44 (DRAPES) IMPLANT
DRSG OPSITE POSTOP 4X10 (GAUZE/BANDAGES/DRESSINGS) ×1 IMPLANT
DURAPREP 26ML APPLICATOR (WOUND CARE) ×1 IMPLANT
GAUZE 4X4 16PLY RFD (DISPOSABLE) IMPLANT
GLOVE SURG LTX SZ7 (GLOVE) ×1 IMPLANT
GLOVE SURG UNDER POLY LF SZ7 (GLOVE) ×2 IMPLANT
GOWN STRL REUS W/TWL LRG LVL3 (GOWN DISPOSABLE) ×3 IMPLANT
HIBICLENS CHG 4% 4OZ (MISCELLANEOUS) ×1 IMPLANT
KIT TURNOVER CYSTO (KITS) ×1 IMPLANT
LIGASURE IMPACT 36 18CM CVD LR (INSTRUMENTS) IMPLANT
NEEDLE HYPO 22GX1.5 SAFETY (NEEDLE) ×1 IMPLANT
NS IRRIG 1000ML POUR BTL (IV SOLUTION) ×1 IMPLANT
PACK ABDOMINAL GYN (CUSTOM PROCEDURE TRAY) ×1 IMPLANT
PAD ARMBOARD 7.5X6 YLW CONV (MISCELLANEOUS) ×1 IMPLANT
PAD OB MATERNITY 4.3X12.25 (PERSONAL CARE ITEMS) ×1 IMPLANT
SPONGE LAP 18X18 RF (DISPOSABLE) IMPLANT
STRIP CLOSURE SKIN 1/2X4 (GAUZE/BANDAGES/DRESSINGS) IMPLANT
SUT MNCRL 0 MO-4 VIOLET 18 CR (SUTURE) ×3 IMPLANT
SUT MNCRL 0 VIOLET 6X18 (SUTURE) ×1 IMPLANT
SUT MNCRL AB 0 CT1 27 (SUTURE) ×1 IMPLANT
SUT MONOCRYL 0 6X18 (SUTURE)
SUT MONOCRYL 0 MO 4 18  CR/8 (SUTURE)
SUT PDS AB 0 CTX 60 (SUTURE) ×2 IMPLANT
SUT PLAIN 2 0 XLH (SUTURE) IMPLANT
SUT VIC AB 4-0 KS 27 (SUTURE) IMPLANT
TOWEL OR 17X26 10 PK STRL BLUE (TOWEL DISPOSABLE) ×2 IMPLANT
TRAY FOLEY W/BAG SLVR 14FR (SET/KITS/TRAYS/PACK) ×1 IMPLANT

## 2020-06-24 NOTE — Progress Notes (Signed)
Pt pre-op BP elevated, remained elevated after pre-procedure complete.  Discussed between surgeon and anesthesiologist. Procedure cancelled. Pt counseled by surgeon re: plan for rescheduling/management of BP.

## 2020-06-24 NOTE — Progress Notes (Signed)
At bedside with anesthesia staff to discuss plan.  In Pre Op area patient noted to have significantly elevated blood pressures.  Vitals:   06/18/20 1817 06/24/20 0553  BP:  (!) 193/114  Pulse:  78  Resp:  18  Temp:  (!) 97.3 F (36.3 C)  TempSrc:  Oral  SpO2:  99%  Weight: 90.7 kg 92.8 kg  Height: 5\' 2"  (1.575 m) 5\' 2"  (1.575 m)   Patient has been managed by PCP for HTN with valsartan-HCTZ.  She also has  Our last visit in office was 05/15/2020 and BP was 138/100.  Discussed with patient that current BP range would significant increase risk of intraoperative or postoperative complication. And although her pain has been ongoing and present, this is otherwise an elective procedure and recommend medical optimization prior to proceeding.  Patient upset but agrees with decision making.  Will plan to reschedule procedure following medical optimization (either with current office or via cardiology referral).  All questions answered.  Alpha Gula MD

## 2020-06-25 ENCOUNTER — Encounter: Payer: Self-pay | Admitting: Family

## 2020-06-25 ENCOUNTER — Ambulatory Visit (INDEPENDENT_AMBULATORY_CARE_PROVIDER_SITE_OTHER): Payer: 59 | Admitting: Family

## 2020-06-25 VITALS — BP 162/100 | HR 75 | Temp 98.7°F | Ht 62.0 in | Wt 202.4 lb

## 2020-06-25 DIAGNOSIS — I10 Essential (primary) hypertension: Secondary | ICD-10-CM

## 2020-06-25 MED ORDER — AMLODIPINE BESYLATE 5 MG PO TABS: 5.0000 mg | ORAL_TABLET | Freq: Every day | ORAL | 0 refills | Status: DC

## 2020-06-25 NOTE — Patient Instructions (Signed)
Please check with your gynecologist regarding follow-up and blood pressure check.
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
## 2020-06-27 NOTE — Progress Notes (Signed)
Spoke with mary at dr Mardelle Matte office , she will send a medical clearance note to pt pcp for 06-10-2020 surgery

## 2020-06-28 ENCOUNTER — Telehealth: Payer: Self-pay | Admitting: Family

## 2020-06-28 NOTE — Telephone Encounter (Signed)
I have called pt back and relayed the provider message and she stated understanding.She will take the other 5 mg of the medication and she will update [place] with the home BP readings along with what GYN wants her to do.

## 2020-06-28 NOTE — Telephone Encounter (Signed)
I have called pt back and relayed the message from the provider. Pt reports that she has been taking her medication and the diastolic is not going down. She does have an appointment with GYN on Monday for her HTN. Pt reports she will ask them if we need to clear her for her surgery. If the answer is yes she will need to make a f/u appointment and if not then they can do what is needed. We will hear from her Monday on what the decision is.   Pt will make appointment if surgical clearance is still needed from [place].

## 2020-06-28 NOTE — Telephone Encounter (Signed)
Since she is not responding to the 5 mg, please ask her to go ahead and double up to take 10 mg; hopefully, that starts to make some improvement for her.  Let us know what GYN needs and what her blood pressure readings are.

## 2020-06-28 NOTE — Telephone Encounter (Signed)
Her GYN sent over a surgical clearance form. We will have to see her to re-check the blood pressure before I can clear her unless they told her differently.  Please schedule her for one day next week so we can see how new medication is working for her.

## 2020-07-03 ENCOUNTER — Other Ambulatory Visit: Payer: Self-pay

## 2020-07-03 ENCOUNTER — Encounter (HOSPITAL_BASED_OUTPATIENT_CLINIC_OR_DEPARTMENT_OTHER): Payer: Self-pay | Admitting: Obstetrics and Gynecology

## 2020-07-03 NOTE — Progress Notes (Addendum)
YOU ARE SCHEDULED FOR A COVID TEST  07-09-2020 @ 230 PM. THIS TEST MUST BE DONE BEFORE SURGERY. GO TO  [place]. [place], [place], IT IS APPROXIMATELY 2 MINUTES PAST [employer] ON THE RIGHT AND REMAIN IN YOUR CAR, THIS IS A DRIVE UP TEST. ONCE YOUR COVID TEST IS DONE PLEASE FOLLOW ALL THE QUARANTINE  INSTRUCTIONS GIVEN IN YOUR HANDOUT.      Your procedure is scheduled on 07-11-2020  Report to [facility].   Call this number if you have problems the morning of surgery  :[phone].   OUR ADDRESS IS [place].  WE ARE LOCATED IN [facility].  PLEASE BRING YOUR INSURANCE CARD AND PHOTO ID DAY OF SURGERY.  ONLY ONE PERSON ALLOWED IN FACILITY WAITING AREA.                                     REMEMBER:  DO NOT EAT FOOD, CANDY GUM OR MINTS  AFTER MIDNIGHT . YOU MAY HAVE CLEAR LIQUIDS FROM MIDNIGHT UNTIL 430 AM. NO CLEAR LIQUIDS AFTER 430 AM DAY OF SURGERY.   YOU MAY  BRUSH YOUR TEETH MORNING OF SURGERY AND RINSE YOUR MOUTH OUT, NO CHEWING GUM CANDY OR MINTS.    CLEAR LIQUID DIET   Foods Allowed                                                                     Foods Excluded  Coffee and tea, regular and decaf                             liquids that you cannot  Plain Jell-O any favor except red or purple                                           see through such as: Fruit ices (not with fruit pulp)                                     milk, soups, orange juice  Iced Popsicles                                    All solid food Carbonated beverages, regular and diet                                    Cranberry, grape and apple juices Sports drinks like Gatorade Lightly seasoned clear broth or consume(fat free) Sugar, honey syrup  Sample Menu Breakfast                                Lunch  Supper Cranberry juice                    Beef broth                            Chicken  broth Jell-O                                     Grape juice                           Apple juice Coffee or tea                        Jell-O                                      Popsicle                                                Coffee or tea                        Coffee or tea  _____________________________________________________________________     TAKE THESE MEDICATIONS MORNING OF SURGERY WITH A SIP OF WATER: AMLODIPINE.  ONE VISITOR IS ALLOWED IN WAITING ROOM ONLY DAY OF SURGERY.  NO VISITOR MAY SPEND THE NIGHT.  VISITOR ARE ALLOWED TO STAY UNTIL 800 PM.                                    DO NOT WEAR JEWERLY, MAKE UP. DO NOT WEAR LOTIONS, POWDERS, PERFUMES OR DEODORANT. DO NOT SHAVE FOR 24 HOURS PRIOR TO DAY OF SURGERY. MEN MAY SHAVE FACE AND NECK. CONTACTS, GLASSES, OR DENTURES MAY NOT BE WORN TO SURGERY.                                    [name] IS NOT RESPONSIBLE  FOR ANY BELONGINGS.                                                                    Marland Kitchen           [name] - Preparing for Surgery Before surgery, you can play an important role.  Because skin is not sterile, your skin needs to be as free of germs as possible.  You can reduce the number of germs on your skin by washing with CHG (chlorahexidine gluconate) soap before surgery.  CHG is an antiseptic cleaner which kills germs and bonds with the skin to continue killing germs even after washing. Please DO NOT use if you have an allergy to CHG or antibacterial soaps.  If your skin becomes reddened/irritated stop using  the CHG and inform your nurse when you arrive at Short Stay. Do not shave (including legs and underarms) for at least 48 hours prior to the first CHG shower.  You may shave your face/neck. Please follow these instructions carefully:  1.  Shower with CHG Soap the night before surgery and the  morning of Surgery.  2.  If you choose to wash your hair, wash your hair first as usual with your  normal   shampoo.  3.  After you shampoo, rinse your hair and body thoroughly to remove the  shampoo.                            4.  Use CHG as you would any other liquid soap.  You can apply chg directly  to the skin and wash                      Gently with a scrungie or clean washcloth.  5.  Apply the CHG Soap to your body ONLY FROM THE NECK DOWN.   Do not use on face/ open                           Wound or open sores. Avoid contact with eyes, ears mouth and genitals (private parts).                       Wash face,  Genitals (private parts) with your normal soap.             6.  Wash thoroughly, paying special attention to the area where your surgery  will be performed.  7.  Thoroughly rinse your body with warm water from the neck down.  8.  DO NOT shower/wash with your normal soap after using and rinsing off  the CHG Soap.                9.  Pat yourself dry with a clean towel.            10.  Wear clean pajamas.            11.  Place clean sheets on your bed the night of your first shower and do not  sleep with pets. Day of Surgery : Do not apply any lotions/deodorants the morning of surgery.  Please wear clean clothes to the hospital/surgery center.  FAILURE TO FOLLOW THESE INSTRUCTIONS MAY RESULT IN THE CANCELLATION OF YOUR SURGERY PATIENT SIGNATURE_________________________________  NURSE SIGNATURE__________________________________  ________________________________________________________________________                                                        QUESTIONS Angel Tucker PRE OP NURSE PHONE [phone]

## 2020-07-03 NOTE — Progress Notes (Addendum)
Spoke w/ via phone for pre-op interview---PT  Lab needs dos----  URINE PREG             Lab results------HAS LAB APPT 07-09-2020 at 1300 for cbc bmp t & s COVID test ------07-09-2020 1430 Arrive at -------530 am 07-11-2020 NPO after MN NO Solid Food.  Clear liquids from MN until---430 am then npo Med rec completed Medications to take morning of surgery -----amlodipine Diabetic medication -----n/a Patient instructed to bring photo id and insurance card day of surgery Patient aware to have Driver (ride ) / caregiver  Daughter Delphina Cahill   for 24 hours after surgery  Patient Special Instructions -----pt given overnight stay at [facility] main hospital instructions Pre-Op special Istructions -----none Patient verbalized understanding of instructions that were given at this phone interview. Patient denies shortness of breath, chest pain, fever, cough at this phone interview.  Pt will remove nose piercing and put plastic insert in  Addendum: received lov/medical clearance form [facility] dated 07-05-2020 and placed on chart for 07-11-2020 surgery

## 2020-07-05 ENCOUNTER — Encounter: Payer: Self-pay | Admitting: Family

## 2020-07-05 ENCOUNTER — Telehealth: Payer: Self-pay

## 2020-07-05 ENCOUNTER — Other Ambulatory Visit: Payer: Self-pay

## 2020-07-05 ENCOUNTER — Ambulatory Visit (INDEPENDENT_AMBULATORY_CARE_PROVIDER_SITE_OTHER): Payer: 59 | Admitting: Family

## 2020-07-05 VITALS — BP 140/88 | HR 80 | Temp 98.2°F | Ht 62.0 in | Wt 202.2 lb

## 2020-07-05 DIAGNOSIS — I10 Essential (primary) hypertension: Secondary | ICD-10-CM

## 2020-07-05 MED ORDER — AMLODIPINE BESYLATE 10 MG PO TABS: 10.0000 mg | ORAL_TABLET | Freq: Every day | ORAL | 1 refills | Status: DC

## 2020-07-05 NOTE — Telephone Encounter (Signed)
Pt has come to the office today to follow up on her BP. Mickel Baas has cleared her for surgery and that form has been faxed with confirmation.
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
## 2020-07-08 NOTE — Telephone Encounter (Signed)
[employer] [facility] [employer] called and was wondering if form could be re faxed. She said that they did not get it. She said to please attention Karen Chafe.    Fax: [phone]

## 2020-07-08 NOTE — Telephone Encounter (Signed)
Forms refaxed

## 2020-07-09 ENCOUNTER — Other Ambulatory Visit (HOSPITAL_COMMUNITY)
Admission: RE | Admit: 2020-07-09 | Discharge: 2020-07-09 | Disposition: A | Discharge status: Home / Self Care | Payer: 59 | Source: Ambulatory Visit | Attending: Obstetrics and Gynecology | Admitting: Obstetrics and Gynecology

## 2020-07-09 ENCOUNTER — Other Ambulatory Visit: Payer: Self-pay

## 2020-07-09 ENCOUNTER — Encounter (HOSPITAL_COMMUNITY)
Admission: RE | Admit: 2020-07-09 | Discharge: 2020-07-09 | Disposition: A | Discharge status: Home / Self Care | Payer: 59 | Source: Ambulatory Visit | Attending: Obstetrics and Gynecology | Admitting: Obstetrics and Gynecology

## 2020-07-09 DIAGNOSIS — Z01812 Encounter for preprocedural laboratory examination: Secondary | ICD-10-CM | POA: Insufficient documentation

## 2020-07-09 DIAGNOSIS — Z20822 Contact with and (suspected) exposure to covid-19: Secondary | ICD-10-CM | POA: Insufficient documentation

## 2020-07-09 LAB — BASIC METABOLIC PANEL
Anion gap: 8 (ref 5–15)
BUN: 9 mg/dL (ref 6–20)
CO2: 25 mmol/L (ref 22–32)
Calcium: 8.9 mg/dL (ref 8.9–10.3)
Chloride: 102 mmol/L (ref 98–111)
Creatinine, Ser: 0.74 mg/dL (ref 0.44–1.00)
GFR, Estimated: >60 mL/min (ref >60)
Glucose, Bld: 242 mg/dL — ABNORMAL HIGH (ref 70–99)
Potassium: 3.3 mmol/L — ABNORMAL LOW (ref 3.5–5.1)
Sodium: 135 mmol/L (ref 135–145)

## 2020-07-09 LAB — CBC
HCT: 42.9 % (ref 36.0–46.0)
Hemoglobin: 14.1 g/dL (ref 12.0–15.0)
MCH: 27.9 pg (ref 26.0–34.0)
MCHC: 32.9 g/dL (ref 30.0–36.0)
MCV: 84.8 fL (ref 80.0–100.0)
Platelets: 196 10*3/uL (ref 150–400)
RBC: 5.06 MIL/uL (ref 3.87–5.11)
RDW: 13.1 % (ref 11.5–15.5)
WBC: 4.5 10*3/uL (ref 4.0–10.5)
nRBC: 0 % (ref 0.0–0.2)

## 2020-07-09 LAB — SARS CORONAVIRUS 2 (TAT 6-24 HRS): SARS Coronavirus 2: NEGATIVE

## 2020-07-10 NOTE — Anesthesia Preprocedure Evaluation (Addendum)
Anesthesia Evaluation  Patient identified by MRN, date of birth, ID band Patient awake    Reviewed: Allergy & Precautions, NPO status , Patient's Chart, lab work & pertinent test results  Airway Mallampati: I  TM Distance: >3 FB Neck ROM: Full    Dental no notable dental hx. (+) Teeth Intact, Dental Advisory Given, Chipped, Missing,    Pulmonary neg pulmonary ROS, sleep apnea , Current SmokerPatient did not abstain from smoking.,    Pulmonary exam normal breath sounds clear to auscultation       Cardiovascular hypertension, Pt. on medications Normal cardiovascular exam Rhythm:Regular Rate:Normal     Neuro/Psych  Headaches, PSYCHIATRIC DISORDERS Anxiety Depression negative neurological ROS  negative psych ROS   GI/Hepatic   Endo/Other  Morbid obesity  Renal/GU K+ 3.2     Musculoskeletal negative musculoskeletal ROS (+)   Abdominal (+) + obese,   Peds  Hematology Hgb 12.9   Anesthesia Other Findings   Reproductive/Obstetrics                            Anesthesia Physical  Anesthesia Plan  ASA: III  Anesthesia Plan: General   Post-op Pain Management:    Induction: Intravenous  PONV Risk Score and Plan: 3 and Treatment may vary due to age or medical condition, Ondansetron, Midazolam and Dexamethasone  Airway Management Planned: Oral ETT  Additional Equipment: None  Intra-op Plan:   Post-operative Plan: Extubation in OR  Informed Consent:     Dental advisory given  Plan Discussed with: CRNA and Anesthesiologist  Anesthesia Plan Comments: (GA w Lidocaine infusion + ketamine 0.5 mg/ kg  Blood pressure better controlled this am. )       Anesthesia Quick Evaluation

## 2020-07-11 ENCOUNTER — Encounter (HOSPITAL_COMMUNITY)
Admission: RE | Disposition: A | Discharge status: Home / Self Care | Payer: Self-pay | Source: Home / Self Care | Attending: Obstetrics and Gynecology

## 2020-07-11 ENCOUNTER — Encounter (HOSPITAL_BASED_OUTPATIENT_CLINIC_OR_DEPARTMENT_OTHER): Payer: Self-pay | Admitting: Obstetrics and Gynecology

## 2020-07-11 ENCOUNTER — Inpatient Hospital Stay (HOSPITAL_BASED_OUTPATIENT_CLINIC_OR_DEPARTMENT_OTHER): Payer: 59 | Admitting: Anesthesiology

## 2020-07-11 ENCOUNTER — Other Ambulatory Visit: Payer: Self-pay

## 2020-07-11 ENCOUNTER — Observation Stay (HOSPITAL_BASED_OUTPATIENT_CLINIC_OR_DEPARTMENT_OTHER)
Admission: RE | Admit: 2020-07-11 | Discharge: 2020-07-13 | Disposition: A | Discharge status: Home / Self Care | Payer: 59 | Attending: Obstetrics and Gynecology | Admitting: Obstetrics and Gynecology

## 2020-07-11 DIAGNOSIS — R739 Hyperglycemia, unspecified: Secondary | ICD-10-CM

## 2020-07-11 DIAGNOSIS — F17210 Nicotine dependence, cigarettes, uncomplicated: Secondary | ICD-10-CM | POA: Diagnosis not present

## 2020-07-11 DIAGNOSIS — N802 Endometriosis of fallopian tube: Secondary | ICD-10-CM | POA: Insufficient documentation

## 2020-07-11 DIAGNOSIS — Z794 Long term (current) use of insulin: Secondary | ICD-10-CM | POA: Insufficient documentation

## 2020-07-11 DIAGNOSIS — N800 Endometriosis of uterus: Secondary | ICD-10-CM | POA: Diagnosis not present

## 2020-07-11 DIAGNOSIS — N840 Polyp of corpus uteri: Secondary | ICD-10-CM | POA: Insufficient documentation

## 2020-07-11 DIAGNOSIS — E876 Hypokalemia: Secondary | ICD-10-CM

## 2020-07-11 DIAGNOSIS — E119 Type 2 diabetes mellitus without complications: Secondary | ICD-10-CM | POA: Diagnosis not present

## 2020-07-11 DIAGNOSIS — R102 Pelvic and perineal pain unspecified side: Secondary | ICD-10-CM | POA: Diagnosis present

## 2020-07-11 DIAGNOSIS — D259 Leiomyoma of uterus, unspecified: Secondary | ICD-10-CM | POA: Diagnosis not present

## 2020-07-11 DIAGNOSIS — N801 Endometriosis of ovary: Secondary | ICD-10-CM | POA: Insufficient documentation

## 2020-07-11 DIAGNOSIS — I10 Essential (primary) hypertension: Secondary | ICD-10-CM | POA: Insufficient documentation

## 2020-07-11 DIAGNOSIS — I160 Hypertensive urgency: Secondary | ICD-10-CM | POA: Diagnosis present

## 2020-07-11 DIAGNOSIS — E1169 Type 2 diabetes mellitus with other specified complication: Secondary | ICD-10-CM

## 2020-07-11 HISTORY — PX: HYSTERECTOMY ABDOMINAL WITH SALPINGECTOMY: SHX6725

## 2020-07-11 HISTORY — DX: Pelvic and perineal pain: R10.2

## 2020-07-11 HISTORY — DX: Hypertensive urgency: I16.0

## 2020-07-11 HISTORY — DX: Hyperglycemia, unspecified: R73.9

## 2020-07-11 HISTORY — DX: Hypokalemia: E87.6

## 2020-07-11 LAB — TYPE AND SCREEN
ABO/RH(D): O POS
Antibody Screen: NEGATIVE

## 2020-07-11 LAB — GLUCOSE, CAPILLARY
Glucose-Capillary: 314 mg/dL — ABNORMAL HIGH (ref 70–99)
Glucose-Capillary: 365 mg/dL — ABNORMAL HIGH (ref 70–99)

## 2020-07-11 LAB — POCT PREGNANCY, URINE: Preg Test, Ur: NEGATIVE

## 2020-07-11 SURGERY — HYSTERECTOMY, TOTAL, ABDOMINAL, WITH SALPINGECTOMY
Anesthesia: General | Site: Abdomen | Laterality: Bilateral

## 2020-07-11 MED ORDER — POTASSIUM CHLORIDE CRYS ER 20 MEQ PO TBCR
30.0000 meq | EXTENDED_RELEASE_TABLET | Freq: Once | ORAL | Status: AC
  Administered 2020-07-11: 30 meq via ORAL
  Filled 2020-07-11: qty 1

## 2020-07-11 MED ORDER — OXYCODONE HCL 5 MG/5ML PO SOLN: 5.0000 mg | Freq: Once | ORAL | Status: DC | PRN

## 2020-07-11 MED ORDER — IRBESARTAN 150 MG PO TABS
300.0000 mg | ORAL_TABLET | Freq: Every day | ORAL | Status: DC
  Administered 2020-07-11 – 2020-07-13 (×3): 300 mg via ORAL
  Filled 2020-07-11 (×3): qty 2

## 2020-07-11 MED ORDER — ESMOLOL HCL 100 MG/10ML IV SOLN
INTRAVENOUS | Status: DC | PRN
  Administered 2020-07-11 (×4): 20 mg via INTRAVENOUS

## 2020-07-11 MED ORDER — ACETAMINOPHEN 10 MG/ML IV SOLN
INTRAVENOUS | Status: AC
  Filled 2020-07-11: qty 100

## 2020-07-11 MED ORDER — CEFAZOLIN SODIUM-DEXTROSE 2-4 GM/100ML-% IV SOLN
2.0000 g | INTRAVENOUS | Status: AC
  Administered 2020-07-11: 2 g via INTRAVENOUS

## 2020-07-11 MED ORDER — CEFAZOLIN SODIUM-DEXTROSE 2-4 GM/100ML-% IV SOLN
INTRAVENOUS | Status: AC
  Filled 2020-07-11: qty 100

## 2020-07-11 MED ORDER — MIDAZOLAM HCL 2 MG/2ML IJ SOLN
INTRAMUSCULAR | Status: AC
  Filled 2020-07-11: qty 2

## 2020-07-11 MED ORDER — LACTATED RINGERS IV SOLN: INTRAVENOUS | Status: DC

## 2020-07-11 MED ORDER — OXYCODONE HCL 5 MG PO TABS
5.0000 mg | ORAL_TABLET | ORAL | Status: DC | PRN
  Administered 2020-07-11 – 2020-07-13 (×7): 10 mg via ORAL
  Filled 2020-07-11 (×7): qty 2

## 2020-07-11 MED ORDER — ACETAMINOPHEN 10 MG/ML IV SOLN
INTRAVENOUS | Status: DC | PRN
  Administered 2020-07-11: 1000 mg via INTRAVENOUS

## 2020-07-11 MED ORDER — LABETALOL HCL 5 MG/ML IV SOLN
10.0000 mg | INTRAVENOUS | Status: DC | PRN
  Filled 2020-07-11: qty 4

## 2020-07-11 MED ORDER — ESMOLOL HCL 100 MG/10ML IV SOLN
INTRAVENOUS | Status: AC
  Filled 2020-07-11: qty 10

## 2020-07-11 MED ORDER — POVIDONE-IODINE 10 % EX SWAB
2.0000 "application " | Freq: Once | CUTANEOUS | Status: AC
  Administered 2020-07-11: 2 via TOPICAL

## 2020-07-11 MED ORDER — HYDROCHLOROTHIAZIDE 25 MG PO TABS
25.0000 mg | ORAL_TABLET | Freq: Every day | ORAL | Status: DC
  Administered 2020-07-11 – 2020-07-13 (×3): 25 mg via ORAL
  Filled 2020-07-11 (×3): qty 1

## 2020-07-11 MED ORDER — DOCUSATE SODIUM 100 MG PO CAPS
100.0000 mg | ORAL_CAPSULE | Freq: Every day | ORAL | Status: DC | PRN
  Administered 2020-07-13: 100 mg via ORAL
  Filled 2020-07-11: qty 1

## 2020-07-11 MED ORDER — ONDANSETRON HCL 4 MG/2ML IJ SOLN: 4.0000 mg | Freq: Once | INTRAMUSCULAR | Status: DC | PRN

## 2020-07-11 MED ORDER — LIDOCAINE 2% (20 MG/ML) 5 ML SYRINGE
INTRAMUSCULAR | Status: AC
  Filled 2020-07-11: qty 20

## 2020-07-11 MED ORDER — PROPOFOL 10 MG/ML IV BOLUS
INTRAVENOUS | Status: DC | PRN
  Administered 2020-07-11: 200 mg via INTRAVENOUS

## 2020-07-11 MED ORDER — ACETAMINOPHEN 325 MG PO TABS
325.0000 mg | ORAL_TABLET | ORAL | Status: DC | PRN
Start: 2020-07-11 — End: 2020-07-11

## 2020-07-11 MED ORDER — LABETALOL HCL 5 MG/ML IV SOLN
10.0000 mg | Freq: Once | INTRAVENOUS | Status: AC | PRN
  Administered 2020-07-11: 10 mg via INTRAVENOUS
  Filled 2020-07-11: qty 4

## 2020-07-11 MED ORDER — ONDANSETRON HCL 4 MG/2ML IJ SOLN
INTRAMUSCULAR | Status: AC
  Filled 2020-07-11: qty 4

## 2020-07-11 MED ORDER — INSULIN ASPART 100 UNIT/ML ~~LOC~~ SOLN
0.0000 [IU] | Freq: Every day | SUBCUTANEOUS | Status: DC
  Administered 2020-07-11: 5 [IU] via SUBCUTANEOUS

## 2020-07-11 MED ORDER — ONDANSETRON HCL 4 MG/2ML IJ SOLN
INTRAMUSCULAR | Status: DC | PRN
  Administered 2020-07-11: 4 mg via INTRAVENOUS

## 2020-07-11 MED ORDER — PHENYLEPHRINE 40 MCG/ML (10ML) SYRINGE FOR IV PUSH (FOR BLOOD PRESSURE SUPPORT)
PREFILLED_SYRINGE | INTRAVENOUS | Status: DC | PRN
  Administered 2020-07-11: 80 ug via INTRAVENOUS

## 2020-07-11 MED ORDER — INSULIN ASPART 100 UNIT/ML ~~LOC~~ SOLN: 0.0000 [IU] | Freq: Three times a day (TID) | SUBCUTANEOUS | Status: DC

## 2020-07-11 MED ORDER — AMLODIPINE BESYLATE 10 MG PO TABS
10.0000 mg | ORAL_TABLET | Freq: Every day | ORAL | Status: DC
  Administered 2020-07-12 – 2020-07-13 (×2): 10 mg via ORAL
  Filled 2020-07-11 (×2): qty 1

## 2020-07-11 MED ORDER — DEXAMETHASONE SODIUM PHOSPHATE 10 MG/ML IJ SOLN
INTRAMUSCULAR | Status: DC | PRN
  Administered 2020-07-11: 10 mg via INTRAVENOUS

## 2020-07-11 MED ORDER — MIDAZOLAM HCL 2 MG/2ML IJ SOLN
INTRAMUSCULAR | Status: DC | PRN
  Administered 2020-07-11: 2 mg via INTRAVENOUS

## 2020-07-11 MED ORDER — ONDANSETRON HCL 4 MG PO TABS: 4.0000 mg | ORAL_TABLET | Freq: Four times a day (QID) | ORAL | Status: DC | PRN

## 2020-07-11 MED ORDER — LIDOCAINE 2% (20 MG/ML) 5 ML SYRINGE
INTRAMUSCULAR | Status: DC | PRN
  Administered 2020-07-11: 1.5 mg/kg/h via INTRAVENOUS

## 2020-07-11 MED ORDER — OXYCODONE HCL 5 MG PO TABS: 5.0000 mg | ORAL_TABLET | Freq: Once | ORAL | Status: DC | PRN

## 2020-07-11 MED ORDER — KETOROLAC TROMETHAMINE 30 MG/ML IJ SOLN
INTRAMUSCULAR | Status: AC
  Filled 2020-07-11: qty 1

## 2020-07-11 MED ORDER — KETOROLAC TROMETHAMINE 30 MG/ML IJ SOLN
INTRAMUSCULAR | Status: DC | PRN
  Administered 2020-07-11: 30 mg via INTRAVENOUS

## 2020-07-11 MED ORDER — PHENYLEPHRINE 40 MCG/ML (10ML) SYRINGE FOR IV PUSH (FOR BLOOD PRESSURE SUPPORT)
PREFILLED_SYRINGE | INTRAVENOUS | Status: AC
  Filled 2020-07-11: qty 10

## 2020-07-11 MED ORDER — FENTANYL CITRATE (PF) 100 MCG/2ML IJ SOLN
INTRAMUSCULAR | Status: AC
  Filled 2020-07-11: qty 2

## 2020-07-11 MED ORDER — LIDOCAINE 2% (20 MG/ML) 5 ML SYRINGE
INTRAMUSCULAR | Status: DC | PRN
  Administered 2020-07-11: 60 mg via INTRAVENOUS

## 2020-07-11 MED ORDER — KETAMINE HCL 10 MG/ML IJ SOLN
INTRAMUSCULAR | Status: DC | PRN
  Administered 2020-07-11: 45 mg via INTRAVENOUS
  Administered 2020-07-11: 5 mg via INTRAVENOUS

## 2020-07-11 MED ORDER — PROPOFOL 10 MG/ML IV BOLUS
INTRAVENOUS | Status: AC
  Filled 2020-07-11: qty 40

## 2020-07-11 MED ORDER — LIP MEDEX EX OINT
TOPICAL_OINTMENT | CUTANEOUS | Status: AC
  Filled 2020-07-11: qty 7

## 2020-07-11 MED ORDER — SODIUM CHLORIDE (PF) 0.9 % IJ SOLN
INTRAMUSCULAR | Status: DC | PRN
  Administered 2020-07-11: 20 mL

## 2020-07-11 MED ORDER — SUGAMMADEX SODIUM 200 MG/2ML IV SOLN
INTRAVENOUS | Status: DC | PRN
  Administered 2020-07-11: 200 mg via INTRAVENOUS

## 2020-07-11 MED ORDER — SODIUM CHLORIDE 0.9 % IV SOLN: INTRAVENOUS | Status: DC

## 2020-07-11 MED ORDER — ALUM & MAG HYDROXIDE-SIMETH 200-200-20 MG/5ML PO SUSP
30.0000 mL | ORAL | Status: DC | PRN
  Administered 2020-07-12 (×2): 30 mL via ORAL
  Filled 2020-07-11 (×2): qty 30

## 2020-07-11 MED ORDER — HYDROMORPHONE HCL 1 MG/ML IJ SOLN
0.5000 mg | INTRAMUSCULAR | Status: DC | PRN
  Administered 2020-07-11 (×2): 0.5 mg via INTRAVENOUS
  Filled 2020-07-11 (×2): qty 0.5

## 2020-07-11 MED ORDER — MENTHOL 3 MG MT LOZG: 1.0000 | LOZENGE | OROMUCOSAL | Status: DC | PRN

## 2020-07-11 MED ORDER — VALSARTAN-HYDROCHLOROTHIAZIDE 320-25 MG PO TABS: 1.0000 | ORAL_TABLET | Freq: Every day | ORAL | Status: DC

## 2020-07-11 MED ORDER — SIMETHICONE 80 MG PO CHEW
80.0000 mg | CHEWABLE_TABLET | Freq: Four times a day (QID) | ORAL | Status: DC | PRN
Start: 2020-07-11 — End: 2020-07-13
  Administered 2020-07-12: 80 mg via ORAL
  Filled 2020-07-11: qty 1

## 2020-07-11 MED ORDER — IBUPROFEN 400 MG PO TABS
800.0000 mg | ORAL_TABLET | Freq: Three times a day (TID) | ORAL | Status: DC
  Administered 2020-07-11 – 2020-07-13 (×6): 800 mg via ORAL
  Filled 2020-07-11 (×6): qty 2

## 2020-07-11 MED ORDER — ROCURONIUM BROMIDE 10 MG/ML (PF) SYRINGE
PREFILLED_SYRINGE | INTRAVENOUS | Status: AC
  Filled 2020-07-11: qty 10

## 2020-07-11 MED ORDER — FENTANYL CITRATE (PF) 100 MCG/2ML IJ SOLN
INTRAMUSCULAR | Status: DC | PRN
  Administered 2020-07-11 (×2): 100 ug via INTRAVENOUS
  Administered 2020-07-11: 50 ug via INTRAVENOUS

## 2020-07-11 MED ORDER — KETAMINE HCL 50 MG/5ML IJ SOSY
PREFILLED_SYRINGE | INTRAMUSCULAR | Status: AC
  Filled 2020-07-11: qty 5

## 2020-07-11 MED ORDER — DEXAMETHASONE SODIUM PHOSPHATE 10 MG/ML IJ SOLN
INTRAMUSCULAR | Status: AC
  Filled 2020-07-11: qty 1

## 2020-07-11 MED ORDER — ZOLPIDEM TARTRATE 5 MG PO TABS: 5.0000 mg | ORAL_TABLET | Freq: Every evening | ORAL | Status: DC | PRN

## 2020-07-11 MED ORDER — ROCURONIUM BROMIDE 10 MG/ML (PF) SYRINGE
PREFILLED_SYRINGE | INTRAVENOUS | Status: DC | PRN
  Administered 2020-07-11: 70 mg via INTRAVENOUS

## 2020-07-11 MED ORDER — MEPERIDINE HCL 25 MG/ML IJ SOLN: 6.2500 mg | INTRAMUSCULAR | Status: DC | PRN

## 2020-07-11 MED ORDER — ACETAMINOPHEN 160 MG/5ML PO SOLN: 325.0000 mg | ORAL | Status: DC | PRN

## 2020-07-11 MED ORDER — GABAPENTIN 100 MG PO CAPS
100.0000 mg | ORAL_CAPSULE | Freq: Three times a day (TID) | ORAL | Status: DC
  Administered 2020-07-11 – 2020-07-13 (×6): 100 mg via ORAL
  Filled 2020-07-11 (×6): qty 1

## 2020-07-11 MED ORDER — FENTANYL CITRATE (PF) 100 MCG/2ML IJ SOLN
25.0000 ug | INTRAMUSCULAR | Status: DC | PRN
  Administered 2020-07-11: 25 ug via INTRAVENOUS

## 2020-07-11 MED ORDER — BUPIVACAINE LIPOSOME 1.3 % IJ SUSP
INTRAMUSCULAR | Status: DC | PRN
  Administered 2020-07-11: 20 mL

## 2020-07-11 MED ORDER — ONDANSETRON HCL 4 MG/2ML IJ SOLN
4.0000 mg | Freq: Four times a day (QID) | INTRAMUSCULAR | Status: DC | PRN
Start: 2020-07-11 — End: 2020-07-13
  Administered 2020-07-11 – 2020-07-12 (×2): 4 mg via INTRAVENOUS
  Filled 2020-07-11 (×2): qty 2

## 2020-07-11 MED ORDER — DEXTROSE-NACL 5-0.45 % IV SOLN: INTRAVENOUS | Status: DC

## 2020-07-11 MED ORDER — FENTANYL CITRATE (PF) 250 MCG/5ML IJ SOLN
INTRAMUSCULAR | Status: AC
  Filled 2020-07-11: qty 5

## 2020-07-11 MED ORDER — ACETAMINOPHEN 325 MG PO TABS: 650.0000 mg | ORAL_TABLET | ORAL | Status: DC | PRN

## 2020-07-11 SURGICAL SUPPLY — 35 items
APL SKNCLS STERI-STRIP NONHPOA (GAUZE/BANDAGES/DRESSINGS)
BENZOIN TINCTURE PRP APPL 2/3 (GAUZE/BANDAGES/DRESSINGS) IMPLANT
CANISTER SUCT 1200ML W/VALVE (MISCELLANEOUS) ×1 IMPLANT
COVER WAND RF STERILE (DRAPES) ×2 IMPLANT
DECANTER SPIKE VIAL GLASS SM (MISCELLANEOUS) ×2 IMPLANT
DRAPE WARM FLUID 44X44 (DRAPES) ×1 IMPLANT
DRSG OPSITE POSTOP 4X10 (GAUZE/BANDAGES/DRESSINGS) ×2 IMPLANT
DURAPREP 26ML APPLICATOR (WOUND CARE) ×2 IMPLANT
GAUZE 4X4 16PLY RFD (DISPOSABLE) ×1 IMPLANT
GLOVE SURG LTX SZ7 (GLOVE) ×2 IMPLANT
GLOVE SURG UNDER POLY LF SZ7 (GLOVE) ×4 IMPLANT
GOWN STRL REUS W/TWL LRG LVL3 (GOWN DISPOSABLE) ×6 IMPLANT
HEMOSTAT ARISTA ABSORB 3G PWDR (HEMOSTASIS) ×1 IMPLANT
HIBICLENS CHG 4% 4OZ (MISCELLANEOUS) ×2 IMPLANT
KIT TURNOVER CYSTO (KITS) ×2 IMPLANT
LIGASURE IMPACT 36 18CM CVD LR (INSTRUMENTS) ×1 IMPLANT
MANIFOLD NEPTUNE II (INSTRUMENTS) ×1 IMPLANT
NEEDLE HYPO 22GX1.5 SAFETY (NEEDLE) ×2 IMPLANT
NS IRRIG 1000ML POUR BTL (IV SOLUTION) ×3 IMPLANT
PACK ABDOMINAL GYN (CUSTOM PROCEDURE TRAY) ×2 IMPLANT
PAD ARMBOARD 7.5X6 YLW CONV (MISCELLANEOUS) ×1 IMPLANT
PAD OB MATERNITY 4.3X12.25 (PERSONAL CARE ITEMS) ×2 IMPLANT
SPONGE LAP 18X18 RF (DISPOSABLE) ×1 IMPLANT
STRIP CLOSURE SKIN 1/2X4 (GAUZE/BANDAGES/DRESSINGS) ×1 IMPLANT
SUT MNCRL 0 MO-4 VIOLET 18 CR (SUTURE) ×3 IMPLANT
SUT MNCRL 0 VIOLET 6X18 (SUTURE) ×1 IMPLANT
SUT MNCRL AB 0 CT1 27 (SUTURE) ×2 IMPLANT
SUT MONOCRYL 0 6X18 (SUTURE)
SUT MONOCRYL 0 MO 4 18  CR/8 (SUTURE) ×6
SUT PDS AB 0 CTX 60 (SUTURE) ×4 IMPLANT
SUT PLAIN 2 0 XLH (SUTURE) ×1 IMPLANT
SUT VIC AB 4-0 KS 27 (SUTURE) ×2 IMPLANT
SYR CONTROL 10ML LL (SYRINGE) ×2 IMPLANT
TOWEL OR 17X26 10 PK STRL BLUE (TOWEL DISPOSABLE) ×4 IMPLANT
TRAY FOLEY W/BAG SLVR 14FR (SET/KITS/TRAYS/PACK) ×2 IMPLANT
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
## 2020-07-11 NOTE — Progress Notes (Signed)
MD paged in regards to BP and pulse. Awaiting response.

## 2020-07-11 NOTE — Progress Notes (Signed)
Notified for elevated BP.  Patient has chronic HTN currently on Norvasc and valsartan-HCTZ.  She required medication adjustment prior to procedure.  Of note, her surgery was previously scheduled and cancelled due to elevated BP prior to these adjustments.  Patient doing well postoperatively, has ambulated, currently working on optimizing pain control.  Vitals:   07/11/20 1751 07/11/20 1821  BP: (!) 171/106 (!) 171/109  Pulse: (!) 110 (!) 110  Resp:    Temp:    SpO2: 100%    Current BP not far from baseline and are lower than previous encounters.  Will consult hospitalist for additional recommendations for antihypertensive management while inpatient.  I appreciate assistance and recs.  Alpha Gula MD

## 2020-07-11 NOTE — Transfer of Care (Signed)
Immediate Anesthesia Transfer of Care Note  Patient: Angel Tucker  Procedure(s) Performed: Procedure(s) (LRB): TOTAL ABDOMINAL HYSTERECTOMY WITH BILATERAL SALPINGO-OOPHORECTOMY (Bilateral)  Patient Location: PACU  Anesthesia Type: General  Level of Consciousness: sleepy  Airway & Oxygen Therapy: Patient Spontanous Breathing with oral airway in place,and Patient connected to face mask oxygen  Post-op Assessment: Report given to PACU RN and Post -op Vital signs reviewed and stable  Post vital signs: Reviewed and stable  Complications: No apparent anesthesia complicationsLast Vitals:  Vitals Value Taken Time  BP 135/82 07/11/20 0948  Temp    Pulse 92 07/11/20 0948  Resp 14 07/11/20 0950  SpO2 100 % 07/11/20 0948  Vitals shown include unvalidated device data.  Last Pain:  Vitals:   07/11/20 0612  TempSrc: Oral  PainSc: 4          Complications: No complications documented.
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
## 2020-07-11 NOTE — Op Note (Signed)
Operative Note  PREOPERATIVE DIAGNOSES: 1. Enlarged fibroid uterus. 2. Pelvic pain 3. History of prior C section x 3  POSTOPERATIVE DIAGNOSES: 1. Same 2. Dense adhesive disease  PROCEDURE PERFORMED: Total abdominal hysterectomy with bilateral salpingo-oophorectomy, lysis of adhesions.   SURGEON: Dr. Alpha Gula MD ASSISTANT: Dr. Arvella Nigh MD  ANESTHESIA: General   ESTIMATED BLOOD LOSS: 150cc.  URINE OUTPUT: clear urine at the end of the procedure.  COMPLICATIONS: None   TUBES: None.  DRAINS: Foley to gravity.  PATHOLOGY: Uterus, cervix, bilateral fallopian tube and ovaries, and multiple fibroids were sent to pathology for review.  FINDINGS: On exam, under anesthesia, normal appearing vulva and vagina, a massively enlarged uterus approximately 16 weeks' in size with anterior fibroid.  In office patient tender to palpation with uterus.  Operative findings demonstrated very dense scar tissue in abdominal wall and rectus.  Thick band of adhesive disease to right anterior uterine wall. Mild adhesive disease on bladder flap. Normal fallopian tubes and ovaries bilaterally. Left ovary with small serous filled cyst.  Some omental adhesions as well. The bowel and omentum were normal appearing.  Procedure: The patient was prepped and draped in the usual sterile manner for an abdominal procedure. A pfannenstiel incision was made carried down to the underlying fascia. Fascia was incised in the midline and extended bilaterally with mayo scissors. Dense scar tissue noted.  Underlying rectus muscles were separated from the fascia superiorly and inferiorly in the usual fashion but with noted dense scar tissue. Peritoneum was entered sharply with hemostat and metz with good visualization of the bladder and bowel. Once inside the abdominal cavity, the uterus was then identified and grasped with upward traction. The round ligaments on either side were identified and individually dissected and  ligated with #0 monocryl suture and divided. This allowed [place] to then create a bladder flap by both blunt and sharp dissection.  The infundibulopelvic ligament on the left was identified and double suture ligated with monocryl and hemostasis noted. This was repeated on the right side.   Dense band of adhesion from the abdominal wall to the anterior uterine wall right of midline was dissected with cautery as well as suture ligation.   We then skeletonized the uterine vessels on either side and carefully dissected the bladder flap anteriorly. Posteriorly, the peritoneum was dissected down toward the uterosacral ligaments. Heaney clamps were then placed at each isthmic portion of the cervical body junction where the uterine arteries adjoined the uterus. These were clamped, ligated and divided using #0 monocryl suture. The remainder of the uterus was then removed by the clamp-cut-ligation technique using #0 Vicryl on all major pedicles. With removal of the uterus, the vaginal cuff was closed with two haney stitches and then with serial figure of either stiches using #0 monocryl. Hemostasis was then inspected and secured throughout the entire area including the vaginal cuff, all pedicles, and the bladder. The patient tolerated the operation nicely. There were no complications associated with this surgical procedure to this point. The sponge count was correct times 2 at this time. The Foley catheter was inspected and clear urine was noted. Having removed all instruments and packs, we then began closure of the abdomen. The fascia was closed with #0 PDS in a running continuous manner and the subcutaneous tissue was also closed with #2-0 chromic in a running continuous manner. Hemostasis was secured throughout the entire layers. The incision was then closed with vicryl in the above operative findings. The patient tolerated the operation nicely  and was then taken to the Recovery Room in good condition.   Alpha Gula  MD

## 2020-07-11 NOTE — Anesthesia Procedure Notes (Signed)
Procedure Name: Intubation Date/Time: 07/11/2020 7:42 AM Performed by: Mechele Claude, CRNA Pre-anesthesia Checklist: Patient identified, Emergency Drugs available, Suction available and Patient being monitored Patient Re-evaluated:Patient Re-evaluated prior to induction Oxygen Delivery Method: Circle system utilized Preoxygenation: Pre-oxygenation with 100% oxygen Induction Type: IV induction and Cricoid Pressure applied Ventilation: Mask ventilation without difficulty Laryngoscope Size: Mac and 3 Tube type: Oral Tube size: 7.0 mm Number of attempts: 1 Airway Equipment and Method: Stylet and Oral airway Placement Confirmation: ETT inserted through vocal cords under direct vision,  positive ETCO2 and breath sounds checked- equal and bilateral Secured at: 21 cm Tube secured with: Tape Dental Injury: Teeth and Oropharynx as per pre-operative assessment

## 2020-07-11 NOTE — Anesthesia Postprocedure Evaluation (Signed)
Anesthesia Post Note  Patient: Angel Tucker  Procedure(s) Performed: TOTAL ABDOMINAL HYSTERECTOMY WITH BILATERAL SALPINGO-OOPHORECTOMY (Bilateral Abdomen)     Patient location during evaluation: PACU Anesthesia Type: General Level of consciousness: awake and alert Pain management: pain level controlled Vital Signs Assessment: post-procedure vital signs reviewed and stable Respiratory status: spontaneous breathing, nonlabored ventilation, respiratory function stable and patient connected to nasal cannula oxygen Cardiovascular status: blood pressure returned to baseline and stable Postop Assessment: no apparent nausea or vomiting Anesthetic complications: no   No complications documented.  Last Vitals:  Vitals:   07/11/20 1126 07/11/20 1233  BP: 123/86 134/88  Pulse: 90 (!) 103  Resp: 15 16  Temp:  36.9 C  SpO2: 100% 100%    Last Pain:  Vitals:   07/11/20 1233  TempSrc: Oral  PainSc:                  Damarco Keysor

## 2020-07-12 DIAGNOSIS — E1165 Type 2 diabetes mellitus with hyperglycemia: Secondary | ICD-10-CM | POA: Diagnosis not present

## 2020-07-12 DIAGNOSIS — R102 Pelvic and perineal pain: Secondary | ICD-10-CM

## 2020-07-12 DIAGNOSIS — R739 Hyperglycemia, unspecified: Secondary | ICD-10-CM

## 2020-07-12 DIAGNOSIS — I160 Hypertensive urgency: Secondary | ICD-10-CM

## 2020-07-12 DIAGNOSIS — D72829 Elevated white blood cell count, unspecified: Secondary | ICD-10-CM

## 2020-07-12 DIAGNOSIS — D259 Leiomyoma of uterus, unspecified: Secondary | ICD-10-CM | POA: Diagnosis not present

## 2020-07-12 DIAGNOSIS — E876 Hypokalemia: Secondary | ICD-10-CM

## 2020-07-12 LAB — CBC
HCT: 37.1 % (ref 36.0–46.0)
Hemoglobin: 12.3 g/dL (ref 12.0–15.0)
MCH: 27.8 pg (ref 26.0–34.0)
MCHC: 33.2 g/dL (ref 30.0–36.0)
MCV: 83.7 fL (ref 80.0–100.0)
Platelets: 178 10*3/uL (ref 150–400)
RBC: 4.43 MIL/uL (ref 3.87–5.11)
RDW: 13.2 % (ref 11.5–15.5)
WBC: 19.9 10*3/uL — ABNORMAL HIGH (ref 4.0–10.5)
nRBC: 0 % (ref 0.0–0.2)

## 2020-07-12 LAB — MAGNESIUM: Magnesium: 1.6 mg/dL — ABNORMAL LOW (ref 1.7–2.4)

## 2020-07-12 LAB — COMPREHENSIVE METABOLIC PANEL
ALT: 54 U/L — ABNORMAL HIGH (ref 0–44)
AST: 49 U/L — ABNORMAL HIGH (ref 15–41)
Albumin: 3.3 g/dL — ABNORMAL LOW (ref 3.5–5.0)
Alkaline Phosphatase: 72 U/L (ref 38–126)
Anion gap: 8 (ref 5–15)
BUN: 14 mg/dL (ref 6–20)
CO2: 24 mmol/L (ref 22–32)
Calcium: 8.8 mg/dL — ABNORMAL LOW (ref 8.9–10.3)
Chloride: 104 mmol/L (ref 98–111)
Creatinine, Ser: 0.86 mg/dL (ref 0.44–1.00)
GFR, Estimated: >60 mL/min (ref >60)
Glucose, Bld: 341 mg/dL — ABNORMAL HIGH (ref 70–99)
Potassium: 3.7 mmol/L (ref 3.5–5.1)
Sodium: 136 mmol/L (ref 135–145)
Total Bilirubin: 0.6 mg/dL (ref 0.3–1.2)
Total Protein: 7 g/dL (ref 6.5–8.1)

## 2020-07-12 LAB — HEMOGLOBIN A1C
Hgb A1c MFr Bld: 6.8 % — ABNORMAL HIGH (ref 4.8–5.6)
Mean Plasma Glucose: 148.46 mg/dL

## 2020-07-12 LAB — GLUCOSE, CAPILLARY
Glucose-Capillary: 248 mg/dL — ABNORMAL HIGH (ref 70–99)
Glucose-Capillary: 301 mg/dL — ABNORMAL HIGH (ref 70–99)
Glucose-Capillary: 214 mg/dL — ABNORMAL HIGH (ref 70–99)
Glucose-Capillary: 224 mg/dL — ABNORMAL HIGH (ref 70–99)
Glucose-Capillary: 190 mg/dL — ABNORMAL HIGH (ref 70–99)

## 2020-07-12 MED ORDER — SITAGLIPTIN PHOSPHATE 50 MG PO TABS: 50.0000 mg | ORAL_TABLET | Freq: Every day | ORAL | 1 refills | Status: DC

## 2020-07-12 MED ORDER — METFORMIN HCL 500 MG PO TABS: ORAL_TABLET | ORAL | 0 refills | Status: DC

## 2020-07-12 MED ORDER — SODIUM CHLORIDE 0.9 % IV SOLN
12.5000 mg | Freq: Four times a day (QID) | INTRAVENOUS | Status: DC | PRN
  Administered 2020-07-12: 12.5 mg via INTRAVENOUS
  Filled 2020-07-12 (×2): qty 0.5

## 2020-07-12 MED ORDER — BLOOD GLUCOSE METER KIT: PACK | 0 refills | Status: AC

## 2020-07-12 MED ORDER — MAGNESIUM SULFATE 2 GM/50ML IV SOLN
2.0000 g | Freq: Once | INTRAVENOUS | Status: AC
  Administered 2020-07-12: 2 g via INTRAVENOUS
  Filled 2020-07-12: qty 50

## 2020-07-12 MED ORDER — INSULIN ASPART 100 UNIT/ML ~~LOC~~ SOLN: 0.0000 [IU] | Freq: Every day | SUBCUTANEOUS | Status: DC

## 2020-07-12 MED ORDER — INSULIN ASPART 100 UNIT/ML ~~LOC~~ SOLN
0.0000 [IU] | Freq: Three times a day (TID) | SUBCUTANEOUS | Status: DC
  Administered 2020-07-12: 5 [IU] via SUBCUTANEOUS
  Administered 2020-07-12: 11 [IU] via SUBCUTANEOUS
  Administered 2020-07-13: 2 [IU] via SUBCUTANEOUS

## 2020-07-12 MED ORDER — INSULIN ASPART 100 UNIT/ML ~~LOC~~ SOLN
4.0000 [IU] | Freq: Three times a day (TID) | SUBCUTANEOUS | Status: DC
  Administered 2020-07-12 – 2020-07-13 (×3): 4 [IU] via SUBCUTANEOUS

## 2020-07-12 MED ORDER — LIVING WELL WITH DIABETES BOOK
Freq: Once | Status: AC
  Filled 2020-07-12: qty 1

## 2020-07-12 NOTE — Progress Notes (Signed)
Nutrition Note   RD consulted for nutrition education regarding diabetes.   Lab Results  Component Value Date   HGBA1C 6.8 (H) 07/12/2020    RD provided "Carbohydrate Counting for People with Diabetes" handout from the Academy of Nutrition and Dietetics. Discussed different food groups and their effects on blood sugar, emphasizing carbohydrate-containing foods. Provided list of carbohydrates and recommended serving sizes of common foods.  Discussed importance of controlled and consistent carbohydrate intake throughout the day. Provided examples of ways to balance meals/snacks and encouraged intake of high-fiber, whole grain complex carbohydrates. Teach back method used.  Expect fair-good compliance. Pt did report she feels drowsy after receiving some pain medication. Requesting saltines as she is having some nausea today. Sipping on broth currently in room.  Interested in outpatient education as well.  Body mass index is 37.59 kg/m. Pt meets criteria for obesity based on current BMI.  Current diet order is heart healthy/CHO modified, patient is consuming approximately 75% of meals at this time. Labs and medications reviewed. No further nutrition interventions warranted at this time. If additional nutrition issues arise, please re-consult RD.  Clayton Bibles, MS, RD, LDN Inpatient Clinical Dietitian Contact information available via Amion

## 2020-07-12 NOTE — Progress Notes (Signed)
PROGRESS NOTE  Angel Tucker KKX:[id number] DOB: 1970/12/22   PCP: Marrian Salvage, FNP  Patient is from: Home  DOA: 07/11/2020 LOS: 1  Reason for consult: Hypertension  Consulting provider: Eyvonne Mechanic, MD  Brief Narrative / Interim history: 50 year old F with PMH of HTN, OSA with CPAP intolerance, prediabetes, morbid obesity, uterine fibroids and chronic pelvic pain who underwent TAH with BSO and lysis of adhesions on 07/11/2020.  She had elevated blood pressure to 180/114 after surgery for which hospitalist service was consulted.   Patient's blood pressure improved after resuming home medications and pain control.    Patient also has new diagnosis of diabetes with A1c of 6.8%.  She is not on medication for diabetes before.  She has hyperglycemia but not in DKA or HHS.  She was started on sliding scale insulin.  CBG improved after adjusting his sliding scale insulin and diet.  I have recommended metformin and GLP-1 inhibitors for diabetes.  These medicines could also help with weight loss which in turn could help with hypertension and sleep apnea.  Patient is anxious about doing injectable medications.  We have agreed to go with metformin and Januvia.  I have also discussed about routine eye exam and foot care.  She is already on valsartan for renal protection.  May consider starting statin outpatient.   I have discussed the above recommendations with the primary team.  I have sent those prescriptions to the pharmacy, and I did discharge instruction to a chart.  Subjective: Seen and examined earlier this morning.  No major events overnight of this morning.  Pain fairly controlled.  She reports heartburn.  Denies shortness of breath.  Denies headache or focal neuro symptoms.  Objective: Vitals:   07/11/20 1821 07/11/20 2003 07/12/20 0029 07/12/20 0402  BP: (!) 171/109 (!) 153/98 (!) 156/97 131/85  Pulse: (!) 110 (!) 104 (!) 101 100  Resp:  17 18 16   Temp:  98.5 F (36.9  C) 98.3 F (36.8 C) 98.3 F (36.8 C)  TempSrc:  Oral Oral Oral  SpO2:  99% 98% 100%  Weight:      Height:        Intake/Output Summary (Last 24 hours) at 07/12/2020 0914 Last data filed at 07/12/2020 0700 Gross per 24 hour  Intake 2189.94 ml  Output 3775 ml  Net -1585.06 ml   Filed Weights   07/11/20 0612  Weight: 93.2 kg    Examination:  GENERAL: No apparent distress.  Nontoxic. HEENT: MMM.  Vision and hearing grossly intact.  NECK: Supple.  No apparent JVD.  RESP:  No IWOB.  Fair aeration bilaterally. CVS:  RRR. Heart sounds normal.  ABD/GI/GU: BS+. Abd soft but slightly tender.  Honeycomb dressing in place. MSK/EXT:  Moves extremities. No apparent deformity. No edema.  SKIN: no apparent skin lesion or wound NEURO: Awake, alert and oriented appropriately.  No apparent focal neuro deficit. PSYCH: Calm. Normal affect.   Procedures:  4/14-TAH and BSO  Microbiology summarized: None  Assessment & Plan: Uncontrolled hypertension: Blood pressure improved this morning.  Blood pressure elevation likely from surgical pain and distress.  She also has OSA and intolerant to CPAP.  She has no chest pain or focal neuro symptoms -Continue home amlodipine and valsartan/HCTZ -Pain control per primary -I would avoid NSAIDs which could contribute to elevated blood pressure  New diagnosis of DM-2 with hyperglycemia: A1c 6.8%.  Hyperglycemia likely stress-induced.  Typical CBG corresponding to A1c is about 150. Recent Labs  Lab 07/11/20  2016 07/11/20 2353 07/12/20 0739  GLUCAP 365* 314* 248*  -Changed diet to heart healthy and carb modified -Increased SSI to moderate and added mealtime coverage at 4 units 3 times daily -Diabetic coordinator consulted for teaching -After lengthy discussion, patient prefers p.o. medication for diabetic management -I have sent Rx for metformin and Januvia to her pharmacy. -Recommended eye exam and foot precaution -Consider adding a statin at  follow-up.  Hypokalemia: Likely due to HCTZ and ARB.  Resolved.  Hypomagnesemia: Mg 1.6. -IV magnesium sulfate 2 g x 1  Leukocytosis: Likely demargination from surgical stress -Recheck CBC at follow-up  Mildly elevated LFT: She has no GI symptoms.  Bili and ALP within normal. -Recheck CMP at follow-up.  Further work-up if remains elevated.  Uterine fibroid/chronic pelvic pain: s/p TAH and BSO on 4/14. -Per primary team  GERD/nausea -GI cocktail -Added Phenergan.  Morbid obesity Body mass index is 37.59 kg/m.  -Metformin could help.  I also recommended GLP-1 inhibitors but patient would like to avoid injectables. -Encourage lifestyle change including diet and exercise to lose weight.        DVT prophylaxis:  Recommend subcu Lovenox if okay from surgical standpoint  Code Status: Full code Family Communication: Patient and/or RN. Available if any question. Level of care: Med-Surg  Disposition: Per primary/Gyn.  I have discussed the above assessment and plan with patient and primary team. She can be discharged on oral medications from hypertension and diabetic standpoint, follow-up with PCP outpatient in 1 to 2 weeks.   Sch Meds:  Scheduled Meds: . amLODipine  10 mg Oral Daily  . gabapentin  100 mg Oral TID  . irbesartan  300 mg Oral Daily   And  . hydrochlorothiazide  25 mg Oral Daily  . ibuprofen  800 mg Oral Q8H  . insulin aspart  0-15 Units Subcutaneous TID WC  . insulin aspart  0-5 Units Subcutaneous QHS  . insulin aspart  4 Units Subcutaneous TID WC  . living well with diabetes book   Does not apply Once   Continuous Infusions: PRN Meds:.acetaminophen, alum & mag hydroxide-simeth, docusate sodium, HYDROmorphone (DILAUDID) injection, labetalol, menthol-cetylpyridinium, ondansetron **OR** ondansetron (ZOFRAN) IV, oxyCODONE, simethicone, zolpidem  Antimicrobials: Anti-infectives (From admission, onward)   Start     Dose/Rate Route Frequency Ordered Stop    07/11/20 0600  ceFAZolin (ANCEF) IVPB 2g/100 mL premix        2 g 200 mL/hr over 30 Minutes Intravenous On call to O.R. 07/11/20 0546 07/11/20 0801       I have personally reviewed the following labs and images: CBC: Recent Labs  Lab 07/09/20 1330 07/12/20 0400  WBC 4.5 19.9*  HGB 14.1 12.3  HCT 42.9 37.1  MCV 84.8 83.7  PLT 196 178   BMP &GFR Recent Labs  Lab 07/09/20 1330 07/12/20 0400  NA 135 136  K 3.3* 3.7  CL 102 104  CO2 25 24  GLUCOSE 242* 341*  BUN 9 14  CREATININE 0.74 0.86  CALCIUM 8.9 8.8*  MG  --  1.6*   Estimated Creatinine Clearance: 84.1 mL/min (by C-G formula based on SCr of 0.86 mg/dL). Liver & Pancreas: Recent Labs  Lab 07/12/20 0400  AST 49*  ALT 54*  ALKPHOS 72  BILITOT 0.6  PROT 7.0  ALBUMIN 3.3*   No results for input(s): LIPASE, AMYLASE in the last 168 hours. No results for input(s): AMMONIA in the last 168 hours. Diabetic: Recent Labs    07/12/20 0400  HGBA1C 6.8*  Recent Labs  Lab 07/11/20 2016 07/11/20 2353 07/12/20 0739  GLUCAP 365* 314* 248*   Cardiac Enzymes: No results for input(s): CKTOTAL, CKMB, CKMBINDEX, TROPONINI in the last 168 hours. No results for input(s): PROBNP in the last 8760 hours. Coagulation Profile: No results for input(s): INR, PROTIME in the last 168 hours. Thyroid Function Tests: No results for input(s): TSH, T4TOTAL, FREET4, T3FREE, THYROIDAB in the last 72 hours. Lipid Profile: No results for input(s): CHOL, HDL, LDLCALC, TRIG, CHOLHDL, LDLDIRECT in the last 72 hours. Anemia Panel: No results for input(s): VITAMINB12, FOLATE, FERRITIN, TIBC, IRON, RETICCTPCT in the last 72 hours. Urine analysis: No results found for: COLORURINE, APPEARANCEUR, LABSPEC, PHURINE, GLUCOSEU, HGBUR, BILIRUBINUR, KETONESUR, PROTEINUR, UROBILINOGEN, NITRITE, LEUKOCYTESUR Sepsis Labs: Invalid input(s): PROCALCITONIN, [place]  Microbiology: Recent Results (from the past 240 hour(s))  SARS CORONAVIRUS 2 (TAT  6-24 HRS) Nasopharyngeal Nasopharyngeal Swab     Status: None   Collection Time: 07/09/20  2:09 PM   Specimen: Nasopharyngeal Swab  Result Value Ref Range Status   SARS Coronavirus 2 NEGATIVE NEGATIVE Final    Comment: (NOTE) SARS-CoV-2 target nucleic acids are NOT DETECTED.  The SARS-CoV-2 RNA is generally detectable in upper and lower respiratory specimens during the acute phase of infection. Negative results do not preclude SARS-CoV-2 infection, do not rule out co-infections with other pathogens, and should not be used as the sole basis for treatment or other patient management decisions. Negative results must be combined with clinical observations, patient history, and epidemiological information. The expected result is Negative.  Fact Sheet for Patients: SugarRoll.be  Fact Sheet for Healthcare Providers: https://www.woods-mathews.com/  This test is not yet approved or cleared by the [place] FDA and  has been authorized for detection and/or diagnosis of SARS-CoV-2 by FDA under an Emergency Use Authorization (EUA). This EUA will remain  in effect (meaning this test can be used) for the duration of the COVID-19 declaration under Se ction 564(b)(1) of the Act, 21 U.S.C. section 360bbb-3(b)(1), unless the authorization is terminated or revoked sooner.  Performed at [facility], [place] [address], [place], [place] [address]     Radiology Studies: No results found.     Shadrick Senne T. [place]  If 7PM-7AM, please contact night-coverage [email or web address] 07/12/2020, 9:14 AM

## 2020-07-12 NOTE — Progress Notes (Signed)
Gynecology Progress Note  Angel Tucker is a 50 y.o. female that is POD#1 following total abdominal hysterectomy, bilateral salpingo-oophorectomy, lysis of adhesions for pelvic pain, fibroid uterus.    Subjective:  Overnight, patient reports no events. She reports well controlled pain with oral medications.  Foley has been discontinued and she has ambulated and voided twice.  She denies nausea, distension. She denies flatus.  She denies fever, chills, SOB, CP, leg pain or edema.  Overall she states she feels well and her previous pelvic pain prior to surgery has resolved.  Objective: Blood pressure 131/85, pulse 100, temperature 98.3 F (36.8 C), temperature source Oral, resp. rate 16, height 5\' 2"  (1.575 m), weight 93.2 kg, SpO2 100 %.  Physical Exam:  General: alert and no distress Chest: nonlabored breathing CV: no peripheral edema Abdomen: soft, nondistended, appropriately tender Incision: honeycomb and steris in place, minimal shadowing. Ext: no signs of DVT  Recent Labs    07/09/20 1330 07/12/20 0400  HGB 14.1 12.3  HCT 42.9 37.1    Assessment/Plan: . Post Op Day 1 s/p TAHBSO, lysis of adhesions o Pain well controlled, foley out, voiding and ambulating. o UO adequate, tolerating PO. Will discontinue IVF. o HGB 12.3, appropriate postoperative drop due to blood loss.  . Medicine consulted for hypertension control, appreciate assistance o BP elevated yesterday evening, improved overnight and this morning.  Additional lab workup pending. o Hyperglycemia - prediabetic on previous A1c, repeat pending.  Multiple BG >300.  D5 discontinued and SSI initiated. - Discussed importance of glycemic control with patient and implications for wound healing.  Suggest target BG <200 in postoperative state. - Discussed that patient may require outpatient insulin therapy and that glycemic control may delay her discharge home.  o Hypokalemia - replaced, 3.3 > 3.7 this AM o Mild elevated  LFTs noted, as well as leukocytosis. Suspect stress response from surgery but will recheck in AM.  Doing well postoperatively but still requires medical optimization for her recovery. Discussed with patient in detail.    LOS: 1 day   Angel Tucker 07/12/2020, 7:20 AM

## 2020-07-12 NOTE — Progress Notes (Addendum)
Inpatient Diabetes Program Recommendations  AACE/ADA: New Consensus Statement on Inpatient Glycemic Control (2015)  Target Ranges:  Prepandial:   less than 140 mg/dL      Peak postprandial:   less than 180 mg/dL (1-2 hours)      Critically ill patients:  140 - 180 mg/dL   Results for FREDRICA, CAPANO (MRN [id number]) as of 07/12/2020 07:02  Ref. Range 07/11/2020 20:16 07/11/2020 23:53  Glucose-Capillary Latest Ref Range: 70 - 99 mg/dL  10 mg Decadron @7 :57am 365 (H)  5 units NOVOLOG  314 (H)   Results for AKEYA, RYTHER (MRN [id number]) as of 07/12/2020 07:02  Ref. Range 02/16/2020 10:21 07/12/2020 04:00  Hemoglobin A1C Latest Ref Range: 4.8 - 5.6 % 6.4 6.8 (H)  (148 mg/dl)    Admit for: Total abdominal hysterectomy with bilateral salpingo-oophorectomy and lysis of adhesions today without complication  Uterine fibroids  History: Pre-Diabetes, HTN  Home DM Meds: None  Current Orders: Novolog Sensitive Correction Scale/ SSI (0-9 units) TID AC + HS     MD- Is this a new diagnosis of Diabetes--Current A1c is 6.8%  If new diagnosis, please address with pt and then the Diabetes Coordinator will meet with pt to also discuss diagnosis as well    Addendum 10am--Met w/ pt at bedside this AM to discuss new diagnosis of diabetes.  Discussed A1C results with pt and explained what an A1C is, basic pathophysiology of DM Type 2, basic home care, basic diabetes diet nutrition principles, importance of checking CBGs and maintaining good CBG control to prevent long-term and short-term complications.  Reviewed signs and symptoms of hyperglycemia and hypoglycemia and how to treat hypoglycemia at home.  Also reviewed blood sugar goals and A1c goals for home.  Info placed into AVS.  Asked pt to check her CBGs either TID AC at home or BID at home (fasting and vary the 2nd check of the day either before or 2 hours after a meal).  Strongly encouraged pt to follow up with her PCP soon after discharge to  discuss new diagnosis diabetes and new diabetes meds.  RNs to provide ongoing basic DM education at bedside with this patient.  Have ordered educational booklet, CBG meter teaching, and OP diabetes education referral.  Have also placed RD consult for DM diet education for this patient.      --Will follow patient during hospitalization--  Wyn Quaker RN, MSN, CDE Diabetes Coordinator Inpatient Glycemic Control Team Team Pager: [phone] (8a-5p)

## 2020-07-12 NOTE — Discharge Instructions (Addendum)
Fingerstick glucose (sugar) goals for home: Before meals: 80-130 mg/dl 2-Hours after meals: less than 180 mg/dl Hemoglobin A1c goal: 7% or less     Managing your diabetes:  Your A1c is 6.8% which meets criteria diabetes.  See below for more information about A1c.  We have started you on metformin and Januvia.  In addition to taking your medications as prescribed, we also recommend lifestyle change including diet and exercise to lose weight which could help with diabetes and blood pressure.  Please follow-up with your primary care doctor in 1 to 2 weeks.  We also recommend an eye exam if you have another one within the last 1 year. Do not walk barefoot.  Wear appropriate shoe with good cushion . Check your feet for any skin break or wound daily.  What is A1c:  The A1C test result reflects your average blood sugar level for the past two to three months. Specifically, the A1C test measures what percentage of your hemoglobin -- a protein in red blood cells that carries oxygen -- is coated with sugar (glycated). The higher your A1C level, the poorer your blood sugar control and the higher your risk of diabetes complications. Portion Size    Choose healthier foods such as 100% whole grains, vegetables, fruits, beans, nut seeds, olive oil, most vegetable oils, fat-free dietary, wild game and fish.   Avoid sweet tea, other sweetened beverages, soda, fruit juice, cold cereal and milk and trans fat.   Eat at least 3 meals and 1-2 snacks per day.  Aim for no more than 5 hours between eating.  Eat breakfast within one hour of getting up.    Exercise at least 150 minutes per week, including weight resistance exercises 3 or 4 times per week.   Try to lose at least 7-10% of your current body weight.  Metformin Tablets What is this medicine? METFORMIN (met FOR min) is used to treat type 2 diabetes. It helps to control blood sugar. Treatment is combined with diet and exercise. This medicine can be used  alone or with other medicines for diabetes. This medicine may be used for other purposes; ask your health care provider or pharmacist if you have questions. COMMON BRAND NAME(S): Glucophage What should I tell my health care provider before I take this medicine? They need to know if you have any of these conditions:  anemia  dehydration  heart disease  frequently drink alcohol-containing beverages  kidney disease  liver disease  polycystic ovary syndrome  serious infection or injury  vomiting  an unusual or allergic reaction to metformin, other medicines, foods, dyes, or preservatives  pregnant or trying to get pregnant  breast-feeding How should I use this medicine? Take this medicine by mouth with a glass of water. Follow the directions on the prescription label. Take this medicine with food. Take your medicine at regular intervals. Do not take your medicine more often than directed. Do not stop taking except on your doctor's advice. Talk to your pediatrician regarding the use of this medicine in children. While this drug may be prescribed for children as young as 41 years of age for selected conditions, precautions do apply. Overdosage: If you think you have taken too much of this medicine contact a poison control center or emergency room at once. NOTE: This medicine is only for you. Do not share this medicine with others. What if I miss a dose? If you miss a dose, take it as soon as you can. If it is  almost time for your next dose, take only that dose. Do not take double or extra doses. What may interact with this medicine? Do not take this medicine with any of the following medications:  certain contrast medicines given before X-rays, CT scans, MRI, or other procedures  dofetilide This medicine may also interact with the following medications:  acetazolamide  alcohol  certain antivirals for HIV or hepatitis  certain medicines for blood pressure, heart disease,  irregular heart beat  cimetidine  dichlorphenamide  digoxin  diuretics  female hormones, like estrogens or progestins and birth control pills  glycopyrrolate  isoniazid  lamotrigine  memantine  methazolamide  metoclopramide  midodrine  niacin  phenothiazines like chlorpromazine, mesoridazine, prochlorperazine, thioridazine  phenytoin  ranolazine  steroid medicines like prednisone or cortisone  stimulant medicines for attention disorders, weight loss, or to stay awake  thyroid medicines  topiramate  trospium  vandetanib  zonisamide This list may not describe all possible interactions. Give your health care provider a list of all the medicines, herbs, non-prescription drugs, or dietary supplements you use. Also tell them if you smoke, drink alcohol, or use illegal drugs. Some items may interact with your medicine. What should I watch for while using this medicine? Visit your doctor or health care professional for regular checks on your progress. A test called the HbA1C (A1C) will be monitored. This is a simple blood test. It measures your blood sugar control over the last 2 to 3 months. You will receive this test every 3 to 6 months. Learn how to check your blood sugar. Learn the symptoms of low and high blood sugar and how to manage them. Always carry a quick-source of sugar with you in case you have symptoms of low blood sugar. Examples include hard sugar candy or glucose tablets. Make sure others know that you can choke if you eat or drink when you develop serious symptoms of low blood sugar, such as seizures or unconsciousness. They must get medical help at once. Tell your doctor or health care professional if you have high blood sugar. You might need to change the dose of your medicine. If you are sick or exercising more than usual, you might need to change the dose of your medicine. Do not skip meals. Ask your doctor or health care professional if you should  avoid alcohol. Many nonprescription cough and cold products contain sugar or alcohol. These can affect blood sugar. This medicine may cause ovulation in premenopausal women who do not have regular monthly periods. This may increase your chances of becoming pregnant. You should not take this medicine if you become pregnant or think you may be pregnant. Talk with your doctor or health care professional about your birth control options while taking this medicine. Contact your doctor or health care professional right away if you think you are pregnant. If you are going to need surgery, a MRI, CT scan, or other procedure, tell your doctor that you are taking this medicine. You may need to stop taking this medicine before the procedure. Wear a medical ID bracelet or chain, and carry a card that describes your disease and details of your medicine and dosage times. This medicine may cause a decrease in folic acid and vitamin B12. You should make sure that you get enough vitamins while you are taking this medicine. Discuss the foods you eat and the vitamins you take with your health care professional. What side effects may I notice from receiving this medicine? Side effects that  you should report to your doctor or health care professional as soon as possible:  allergic reactions like skin rash, itching or hives, swelling of the face, lips, or tongue  breathing problems  feeling faint or lightheaded, falls  muscle aches or pains  signs and symptoms of low blood sugar such as feeling anxious, confusion, dizziness, increased hunger, unusually weak or tired, sweating, shakiness, cold, irritable, headache, blurred vision, fast heartbeat, loss of consciousness  slow or irregular heartbeat  unusual stomach pain or discomfort  unusually tired or weak Side effects that usually do not require medical attention (report to your doctor or health care professional if they continue or are  bothersome):  diarrhea  headache  heartburn  metallic taste in mouth  nausea  stomach gas, upset This list may not describe all possible side effects. Call your doctor for medical advice about side effects. You may report side effects to FDA at 1-800-FDA-1088. Where should I keep my medicine? Keep out of the reach of children. Store at room temperature between 15 and 30 degrees C (59 and 86 degrees F). Protect from moisture and light. Throw away any unused medicine after the expiration date. NOTE: This sheet is a summary. It may not cover all possible information. If you have questions about this medicine, talk to your doctor, pharmacist, or health care provider.  2021 Elsevier/Gold Standard (2020-02-11 10:29:07)  Sitagliptin oral tablet What is this medicine? SITAGLIPTIN (sit a GLIP tin) helps to treat type 2 diabetes. It helps to control blood sugar. Treatment is combined with diet and exercise. This medicine may be used for other purposes; ask your health care provider or pharmacist if you have questions. COMMON BRAND NAME(S): Januvia What should I tell my health care provider before I take this medicine? They need to know if you have any of these conditions:  diabetic ketoacidosis  kidney disease  pancreatitis  previous swelling of the tongue, face, or lips with difficulty breathing, difficulty swallowing, hoarseness, or tightening of the throat  type 1 diabetes  an unusual or allergic reaction to sitagliptin, other medicines, foods, dyes, or preservatives  pregnant or trying to get pregnant  breast-feeding How should I use this medicine? Take this medicine by mouth with a glass of water. Follow the directions on the prescription label. You can take it with or without food. Do not cut, crush or chew this medicine. Take your dose at the same time each day. Do not take more often than directed. Do not stop taking except on your doctor's advice. A special MedGuide will  be given to you by the pharmacist with each prescription and refill. Be sure to read this information carefully each time. Talk to your health care provider about the use of this drug in children. It is not approved for use in children. Overdosage: If you think you have taken too much of this medicine contact a poison control center or emergency room at once. NOTE: This medicine is only for you. Do not share this medicine with others. What if I miss a dose? If you miss a dose, take it as soon as you can. If it is almost time for your next dose, take only that dose. Do not take double or extra doses. What may interact with this medicine? Do not take this medicine with any of the following medications:  gatifloxacin This medicine may also interact with the following medications:  alcohol  digoxin  insulin  sulfonylureas like glimepiride, glipizide, glyburide This list may  not describe all possible interactions. Give your health care provider a list of all the medicines, herbs, non-prescription drugs, or dietary supplements you use. Also tell them if you smoke, drink alcohol, or use illegal drugs. Some items may interact with your medicine. What should I watch for while using this medicine? Visit your doctor or health care professional for regular checks on your progress. A test called the HbA1C (A1C) will be monitored. This is a simple blood test. It measures your blood sugar control over the last 2 to 3 months. You will receive this test every 3 to 6 months. Learn how to check your blood sugar. Learn the symptoms of low and high blood sugar and how to manage them. Always carry a quick-source of sugar with you in case you have symptoms of low blood sugar. Examples include hard sugar candy or glucose tablets. Make sure others know that you can choke if you eat or drink when you develop serious symptoms of low blood sugar, such as seizures or unconsciousness. They must get medical help at  once. Tell your doctor or health care professional if you have high blood sugar. You might need to change the dose of your medicine. If you are sick or exercising more than usual, you might need to change the dose of your medicine. Do not skip meals. Ask your doctor or health care professional if you should avoid alcohol. Many nonprescription cough and cold products contain sugar or alcohol. These can affect blood sugar. Wear a medical ID bracelet or chain, and carry a card that describes your disease and details of your medicine and dosage times. What side effects may I notice from receiving this medicine? Side effects that you should report to your doctor or health care professional as soon as possible:  allergic reactions like skin rash, itching or hives, swelling of the face, lips, or tongue  breathing problems  general ill feeling or flu-like symptoms  joint pain  loss of appetite  redness, blistering, peeling or loosening of the skin, including inside the mouth  signs and symptoms of heart failure like breathing problems, fast, irregular heartbeat, sudden weight gain; swelling of the ankles, feet, hands; unusually weak or tired  signs and symptoms of low blood sugar such as feeling anxious, confusion, dizziness, increased hunger, unusually weak or tired, sweating, shakiness, cold, irritable, headache, blurred vision, fast heartbeat, loss of consciousness  signs and symptoms of muscle injury like dark urine; trouble passing urine or change in the amount of urine; unusually weak or tired; muscle pain; back pain  unusual stomach upset or pain  vomiting Side effects that usually do not require medical attention (report to your doctor or health care professional if they continue or are bothersome):  diarrhea  headache  sore throat  stomach upset  stuffy or runny nose This list may not describe all possible side effects. Call your doctor for medical advice about side effects.  You may report side effects to FDA at 1-800-FDA-1088. Where should I keep my medicine? Keep out of the reach of children. Store at room temperature between 15 and 30 degrees C (59 and 86 degrees F). Throw away any unused medicine after the expiration date. NOTE: This sheet is a summary. It may not cover all possible information. If you have questions about this medicine, talk to your doctor, pharmacist, or health care provider.  2021 Elsevier/Gold Standard (2019-03-06 11:51:36)

## 2020-07-12 NOTE — Progress Notes (Signed)
Patient blood sugar 365. MD notified. D5 0.45NS stopped. Sliding scale insulin ordered. Will continue to assess patient.

## 2020-07-13 DIAGNOSIS — R102 Pelvic and perineal pain: Secondary | ICD-10-CM | POA: Diagnosis not present

## 2020-07-13 DIAGNOSIS — I160 Hypertensive urgency: Secondary | ICD-10-CM | POA: Diagnosis not present

## 2020-07-13 DIAGNOSIS — E1165 Type 2 diabetes mellitus with hyperglycemia: Secondary | ICD-10-CM | POA: Diagnosis not present

## 2020-07-13 DIAGNOSIS — E876 Hypokalemia: Secondary | ICD-10-CM | POA: Diagnosis not present

## 2020-07-13 DIAGNOSIS — D259 Leiomyoma of uterus, unspecified: Secondary | ICD-10-CM | POA: Diagnosis not present

## 2020-07-13 LAB — LIPID PANEL
Cholesterol: 152 mg/dL (ref 0–200)
HDL: 46 mg/dL (ref >40)
LDL Cholesterol: 92 mg/dL (ref 0–99)
Total CHOL/HDL Ratio: 3.3 RATIO
Triglycerides: 72 mg/dL (ref <150)
VLDL: 14 mg/dL (ref 0–40)

## 2020-07-13 LAB — CBC
HCT: 34.9 % — ABNORMAL LOW (ref 36.0–46.0)
Hemoglobin: 11.3 g/dL — ABNORMAL LOW (ref 12.0–15.0)
MCH: 28.4 pg (ref 26.0–34.0)
MCHC: 32.4 g/dL (ref 30.0–36.0)
MCV: 87.7 fL (ref 80.0–100.0)
Platelets: 175 10*3/uL (ref 150–400)
RBC: 3.98 MIL/uL (ref 3.87–5.11)
RDW: 14.1 % (ref 11.5–15.5)
WBC: 18 10*3/uL — ABNORMAL HIGH (ref 4.0–10.5)
nRBC: 0 % (ref 0.0–0.2)

## 2020-07-13 LAB — COMPREHENSIVE METABOLIC PANEL
ALT: 44 U/L (ref 0–44)
AST: 40 U/L (ref 15–41)
Albumin: 3.4 g/dL — ABNORMAL LOW (ref 3.5–5.0)
Alkaline Phosphatase: 63 U/L (ref 38–126)
Anion gap: 8 (ref 5–15)
BUN: 22 mg/dL — ABNORMAL HIGH (ref 6–20)
CO2: 24 mmol/L (ref 22–32)
Calcium: 8.7 mg/dL — ABNORMAL LOW (ref 8.9–10.3)
Chloride: 106 mmol/L (ref 98–111)
Creatinine, Ser: 0.89 mg/dL (ref 0.44–1.00)
GFR, Estimated: >60 mL/min (ref >60)
Glucose, Bld: 155 mg/dL — ABNORMAL HIGH (ref 70–99)
Potassium: 4.3 mmol/L (ref 3.5–5.1)
Sodium: 138 mmol/L (ref 135–145)
Total Bilirubin: 0.7 mg/dL (ref 0.3–1.2)
Total Protein: 6.9 g/dL (ref 6.5–8.1)

## 2020-07-13 LAB — MAGNESIUM: Magnesium: 2.1 mg/dL (ref 1.7–2.4)

## 2020-07-13 LAB — PHOSPHORUS: Phosphorus: 2.1 mg/dL — ABNORMAL LOW (ref 2.5–4.6)

## 2020-07-13 LAB — GLUCOSE, CAPILLARY: Glucose-Capillary: 146 mg/dL — ABNORMAL HIGH (ref 70–99)

## 2020-07-13 MED ORDER — DOCUSATE SODIUM 100 MG PO CAPS: 100.0000 mg | ORAL_CAPSULE | Freq: Two times a day (BID) | ORAL | 0 refills | Status: DC

## 2020-07-13 MED ORDER — OXYCODONE HCL 5 MG PO TABS: 5.0000 mg | ORAL_TABLET | ORAL | 0 refills | Status: DC | PRN

## 2020-07-13 MED ORDER — NICOTINE POLACRILEX 4 MG MT GUM: 4.0000 mg | CHEWING_GUM | OROMUCOSAL | 0 refills | Status: DC | PRN

## 2020-07-13 MED ORDER — ACETAMINOPHEN 325 MG PO TABS: 650.0000 mg | ORAL_TABLET | Freq: Four times a day (QID) | ORAL | 0 refills | Status: DC | PRN

## 2020-07-13 MED ORDER — IBUPROFEN 200 MG PO TABS: 600.0000 mg | ORAL_TABLET | Freq: Four times a day (QID) | ORAL | 0 refills | Status: DC | PRN

## 2020-07-13 NOTE — Progress Notes (Signed)
PROGRESS NOTE  Angel Tucker:[id number] DOB: 11-16-1970   PCP: Marrian Salvage, FNP  Patient is from: Home  DOA: 07/11/2020 LOS: 1  Reason for consult: Hypertension  Consulting provider: Eyvonne Mechanic, MD  Brief Narrative / Interim history: 50 year old F with PMH of HTN, OSA with CPAP intolerance, prediabetes, morbid obesity, uterine fibroids and chronic pelvic pain who underwent TAH with BSO and lysis of adhesions on 07/11/2020.  She had elevated blood pressure to 180/114 after surgery for which hospitalist service was consulted.   Patient's blood pressure improved after resuming home medications and pain control.    Patient also has new diagnosis of diabetes with A1c of 6.8%.  She is not on medication for diabetes before.  She has hyperglycemia but not in DKA or HHS.  She was started on sliding scale insulin.  CBG improved after adjusting his sliding scale insulin and diet.  I have recommended metformin and GLP-1 inhibitors for diabetes.  These medicines could also help with weight loss which in turn could help with hypertension and sleep apnea.  Patient is anxious about doing injectable medications.  We have agreed to go with metformin and Januvia.  I have also discussed about routine eye exam and foot care.  She is already on valsartan for renal protection.  May consider starting statin outpatient.   I have discussed the above recommendations with the primary team.  I have sent those prescriptions to the pharmacy, and I did discharge instruction to a chart.  Subjective: Seen and examined earlier this morning.  No major events overnight of this morning.  No complaints.  Blood pressure improved tremendously.  She has been normotensive for most part.  Blood glucose improved as well.  CMP without significant finding.  She has leukocytosis that seems to be improving as well.  She is ready to go home.  Objective: Vitals:   07/12/20 0920 07/12/20 1422 07/12/20 2152 07/13/20  0545  BP: (!) 176/106 125/69 121/75 140/89  Pulse: 94 70 83 75  Resp: 17  18 18   Temp: (!) 97.5 F (36.4 C) 98 F (36.7 C) 98.2 F (36.8 C) 98 F (36.7 C)  TempSrc: Oral Oral Oral Oral  SpO2:   97% 100%  Weight:      Height:        Intake/Output Summary (Last 24 hours) at 07/13/2020 1008 Last data filed at 07/13/2020 0800 Gross per 24 hour  Intake 2040 ml  Output 2100 ml  Net -60 ml   Filed Weights   07/11/20 0612  Weight: 93.2 kg    Examination: GENERAL: Sitting on bedside chair eating breakfast. HEENT: MMM.  Vision and hearing grossly intact.  NECK: Supple.  No apparent JVD.  RESP: On RA.  No IWOB.  Fair aeration bilaterally. CVS:  RRR. Heart sounds normal.  ABD/GI/GU: BS+. Abd soft.  Slightly tender.  Honeycomb dressing in place. MSK/EXT:  Moves extremities. No apparent deformity. No edema.  SKIN: no apparent skin lesion or wound NEURO: Awake, alert and oriented appropriately.  No apparent focal neuro deficit. PSYCH: Calm. Normal affect.   Procedures:  4/14-TAH and BSO  Microbiology summarized: None  Assessment & Plan: Uncontrolled hypertension: Blood pressure improved this morning.  Blood pressure elevation likely from surgical pain and distress.  She also has OSA and intolerant to CPAP.  She has no chest pain or focal neuro symptoms -Continue home amlodipine and valsartan/HCTZ -Pain control per primary -Encouraged to avoid NSAID  New diagnosis of DM-2 with hyperglycemia: A1c  6.8%.  Hyperglycemia likely stress-induced.  Typical CBG corresponding to A1c is about 150. Recent Labs  Lab 07/12/20 1126 07/12/20 1621 07/12/20 1914 07/12/20 2146 07/13/20 0722  GLUCAP 301* 214* 224* 190* 146*  -Changed diet to heart healthy and carb modified -On SSI-moderate and mealtime coverage at 4 units 3 times daily -Appreciate input by diabetic coordinator -Patient prefers p.o. medication for diabetic management -I have sent Rx for metformin and Januvia to her  pharmacy. -Recommended eye exam and foot precaution -Consider adding a statin at follow-up.  Her lipid panel is reassuring.  TC 152.  HDL 46.  LDL 92  Hypokalemia: Likely due to HCTZ and ARB.  Resolved.  Hypomagnesemia: Resolved. -IV magnesium sulfate 2 g x 1  Leukocytosis: Likely demargination from surgical stress.  Improved. -Recheck CBC at follow-up.  Mildly elevated LFT: Resolved.  No GI symptoms.  Uterine fibroid/chronic pelvic pain: s/p TAH and BSO on 4/14. -Per primary team  GERD/nausea: Resolved.  Morbid obesity Body mass index is 37.59 kg/m.  -Metformin could help.  I also recommended GLP-1 inhibitors but patient would like to avoid injectables. -Encourage lifestyle change including diet and exercise to lose weight.        DVT prophylaxis:  Recommend subcu Lovenox if okay from surgical standpoint  Code Status: Full code Family Communication: Patient and/or RN. Available if any question. Level of care: Med-Surg  Disposition: Per primary/Gyn.  I have discussed the above assessment and plan with patient and primary team. She can be discharged on oral medications from hypertension and diabetic standpoint, follow-up with PCP outpatient in 1 to 2 weeks.   Sch Meds:  Scheduled Meds: . amLODipine  10 mg Oral Daily  . gabapentin  100 mg Oral TID  . irbesartan  300 mg Oral Daily   And  . hydrochlorothiazide  25 mg Oral Daily  . ibuprofen  800 mg Oral Q8H  . insulin aspart  0-15 Units Subcutaneous TID WC  . insulin aspart  0-5 Units Subcutaneous QHS  . insulin aspart  4 Units Subcutaneous TID WC   Continuous Infusions: . promethazine (PHENERGAN) injection (IM or IVPB) 12.5 mg (07/12/20 1051)   PRN Meds:.acetaminophen, alum & mag hydroxide-simeth, docusate sodium, HYDROmorphone (DILAUDID) injection, labetalol, menthol-cetylpyridinium, ondansetron **OR** ondansetron (ZOFRAN) IV, oxyCODONE, promethazine (PHENERGAN) injection (IM or IVPB), simethicone,  zolpidem  Antimicrobials: Anti-infectives (From admission, onward)   Start     Dose/Rate Route Frequency Ordered Stop   07/11/20 0600  ceFAZolin (ANCEF) IVPB 2g/100 mL premix        2 g 200 mL/hr over 30 Minutes Intravenous On call to O.R. 07/11/20 0546 07/11/20 0801       I have personally reviewed the following labs and images: CBC: Recent Labs  Lab 07/09/20 1330 07/12/20 0400 07/13/20 0430  WBC 4.5 19.9* 18.0*  HGB 14.1 12.3 11.3*  HCT 42.9 37.1 34.9*  MCV 84.8 83.7 87.7  PLT 196 178 175   BMP &GFR Recent Labs  Lab 07/09/20 1330 07/12/20 0400 07/13/20 0430  NA 135 136 138  K 3.3* 3.7 4.3  CL 102 104 106  CO2 25 24 24   GLUCOSE 242* 341* 155*  BUN 9 14 22*  CREATININE 0.74 0.86 0.89  CALCIUM 8.9 8.8* 8.7*  MG  --  1.6* 2.1  PHOS  --   --  2.1*   Estimated Creatinine Clearance: 81.2 mL/min (by C-G formula based on SCr of 0.89 mg/dL). Liver & Pancreas: Recent Labs  Lab 07/12/20 0400 07/13/20 0430  AST 49* 40  ALT 54* 44  ALKPHOS 72 63  BILITOT 0.6 0.7  PROT 7.0 6.9  ALBUMIN 3.3* 3.4*   No results for input(s): LIPASE, AMYLASE in the last 168 hours. No results for input(s): AMMONIA in the last 168 hours. Diabetic: Recent Labs    07/12/20 0400  HGBA1C 6.8*   Recent Labs  Lab 07/12/20 1126 07/12/20 1621 07/12/20 1914 07/12/20 2146 07/13/20 0722  GLUCAP 301* 214* 224* 190* 146*   Cardiac Enzymes: No results for input(s): CKTOTAL, CKMB, CKMBINDEX, TROPONINI in the last 168 hours. No results for input(s): PROBNP in the last 8760 hours. Coagulation Profile: No results for input(s): INR, PROTIME in the last 168 hours. Thyroid Function Tests: No results for input(s): TSH, T4TOTAL, FREET4, T3FREE, THYROIDAB in the last 72 hours. Lipid Profile: Recent Labs    07/13/20 0430  CHOL 152  HDL 46  LDLCALC 92  TRIG 72  CHOLHDL 3.3   Anemia Panel: No results for input(s): VITAMINB12, FOLATE, FERRITIN, TIBC, IRON, RETICCTPCT in the last 72  hours. Urine analysis: No results found for: COLORURINE, APPEARANCEUR, LABSPEC, PHURINE, GLUCOSEU, HGBUR, BILIRUBINUR, KETONESUR, PROTEINUR, UROBILINOGEN, NITRITE, LEUKOCYTESUR Sepsis Labs: Invalid input(s): PROCALCITONIN, Vivian  Microbiology: Recent Results (from the past 240 hour(s))  SARS CORONAVIRUS 2 (TAT 6-24 HRS) Nasopharyngeal Nasopharyngeal Swab     Status: None   Collection Time: 07/09/20  2:09 PM   Specimen: Nasopharyngeal Swab  Result Value Ref Range Status   SARS Coronavirus 2 NEGATIVE NEGATIVE Final    Comment: (NOTE) SARS-CoV-2 target nucleic acids are NOT DETECTED.  The SARS-CoV-2 RNA is generally detectable in upper and lower respiratory specimens during the acute phase of infection. Negative results do not preclude SARS-CoV-2 infection, do not rule out co-infections with other pathogens, and should not be used as the sole basis for treatment or other patient management decisions. Negative results must be combined with clinical observations, patient history, and epidemiological information. The expected result is Negative.  Fact Sheet for Patients: SugarRoll.be  Fact Sheet for Healthcare Providers: https://www.woods-mathews.com/  This test is not yet approved or cleared by the [place] FDA and  has been authorized for detection and/or diagnosis of SARS-CoV-2 by FDA under an Emergency Use Authorization (EUA). This EUA will remain  in effect (meaning this test can be used) for the duration of the COVID-19 declaration under Se ction 564(b)(1) of the Act, 21 U.S.C. section 360bbb-3(b)(1), unless the authorization is terminated or revoked sooner.  Performed at [facility], [place] [address] [place], [place] [address]     Radiology Studies: No results found.     Tanekia Ryans T. [place]  If 7PM-7AM, please contact night-coverage [email or web address] 07/13/2020, 10:08 AM

## 2020-07-13 NOTE — Progress Notes (Signed)
Gynecology Progress Note  Angel Tucker is a 50 y.o. female that is POD#2 following total abdominal hysterectomy, bilateral salpingo-oophorectomy, lysis of adhesions for pelvic pain, fibroid uterus.  Her stay is also notable for HTN management and new diagnosis of diabetes.  Subjective:  Overnight, patient reports no events. She reports well controlled pain with oral medications. She is ambulating without difficulty, voiding spontaneously, tolerating regular diet, passing flatus.  She denies nausea, distension.  She denies fever, chills, SOB, CP, leg pain or edema. She is eager to be discharged.    Objective: Blood pressure 140/89, pulse 75, temperature 98 F (36.7 C), temperature source Oral, resp. rate 18, height 5\' 2"  (1.575 m), weight 93.2 kg, SpO2 100 %.  Physical Exam:  General: alert and no distress Chest: nonlabored breathing CV: no peripheral edema Abdomen: soft, nondistended, appropriately tender Incision: honeycomb and steris in place, minimal shadowing. Ext: no signs of DVT  Recent Labs    07/12/20 0400 07/13/20 0430  HGB 12.3 11.3*  HCT 37.1 34.9*    Assessment/Plan: . Post Op Day 2 s/p TAHBSO, lysis of adhesions o Pain well controlled, foley out, voiding and ambulating. o Tolerating PO, passing flatus. . Medicine consulted for hypertension control, appreciate assistance o New diagnosis of DM with A1c of 6.8 yesterday.  Appreciate medicine assistance with new medications. Will continue outpatient and ensure follow up. o BG significantly improved.  Discussed importance of glycemic control for wound healing o Appreciate nursing staff and DM coordinator in patient education. o LFTs improved to normal range.  WBC decreasing.   Anemia noted, suspect perioperative equilibration and hemodilution with IVF. Marland Kitchen OK for discharge today. Will Rx pain meds as well as colace and nicotine gum to aid in cessation.  Discussed great importance of smoking cessation for wound healing,  recovery, and overall health.  All questions answered.   LOS: 1 day   [name] 07/13/2020, 8:02 AM

## 2020-07-13 NOTE — Progress Notes (Signed)
Reviewed written d/c instructions w pt and all questions answered. Pt verbalized understanding. Worked w pt yesterday and again today on using a glucose monitoring machine. Pt able to successfully use the machine w a few verbal cues/reminders. Awaiting ride home.

## 2020-07-13 NOTE — Progress Notes (Signed)
D/c via w/c w all belongings in stable condition

## 2020-07-14 NOTE — Discharge Summary (Signed)
Gynecology Discharge Summary  Angel Tucker is a 50 y.o. female that presented on 07/11/2020 for scheduled total abdominal hysterectomy with bilateral salpingo-oophorectomy for severe pelvic pain and fibroid uterus.  Her medical history of notable for hypertension on multiple medications.  Her operation proceeded as planned and was uncomplicated but did require extensive lysis of adhesions from prior surgeries.  On POD#0, medicine was consulted to aid in blood pressure control and for newly diagnosed diabetes.  She also met with diabetes coordinator and dietician. She was begun on glycemic control medications and require sliding scale insulin during her stay.  She was cleared by medicine for discharge.  Postoperatively, she did well and on POD#2, she reported well controlled pain, was voiding spontaneously, tolerating regular diet, passing flatus. She was counseled in detail on importance of glycemic control and smoking cessation for wound healing and recovery. She was stable for discharge home on 07/13/20 with plans for in-office follow up with PCP and our office.  Hemoglobin  Date Value Ref Range Status  07/13/2020 11.3 (L) 12.0 - 15.0 g/dL Final   HCT  Date Value Ref Range Status  07/13/2020 34.9 (L) 36.0 - 46.0 % Final    Physical Exam:  General: alert and no distress Chest: nonlabored breathing CV: no peripheral edema Abdomen: soft, appropriately tender to palpation Incision: healing well DVT Evaluation: No evidence of DVT seen on physical exam.  Discharge Diagnoses: HTN, DM, s/p TAHBSO and LOA  Discharge Information: Date: 07/14/2020 Activity: pelvic rest and as tolerated Diet: diabetic Medications: tylenol, motrin, oxycodone Condition: stable Instructions: refer to practice specific booklet Discharge to: home  Follow-up Information    [facility] Follow up.   Contact information: [place] [address] [phone]                [name] 07/14/2020, 5:50 PM

## 2020-07-15 ENCOUNTER — Encounter (HOSPITAL_BASED_OUTPATIENT_CLINIC_OR_DEPARTMENT_OTHER): Payer: Self-pay | Admitting: Obstetrics and Gynecology

## 2020-07-15 LAB — SURGICAL PATHOLOGY

## 2020-07-16 ENCOUNTER — Telehealth (INDEPENDENT_AMBULATORY_CARE_PROVIDER_SITE_OTHER): Payer: 59 | Admitting: Family

## 2020-07-16 DIAGNOSIS — I10 Essential (primary) hypertension: Secondary | ICD-10-CM | POA: Diagnosis not present

## 2020-07-16 DIAGNOSIS — E119 Type 2 diabetes mellitus without complications: Secondary | ICD-10-CM
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
## 2020-07-25 ENCOUNTER — Encounter: Payer: 59 | Attending: Student | Admitting: Dietician

## 2020-07-25 ENCOUNTER — Other Ambulatory Visit: Payer: Self-pay

## 2020-07-25 ENCOUNTER — Encounter: Payer: Self-pay | Admitting: Dietician

## 2020-07-25 DIAGNOSIS — E119 Type 2 diabetes mellitus without complications: Secondary | ICD-10-CM | POA: Insufficient documentation

## 2020-07-25 LAB — ALDOSTERONE + RENIN ACTIVITY W/ RATIO
ALDO / PRA Ratio: 25.5 (ref 0.0–30.0)
Aldosterone: 11.5 ng/dL (ref 0.0–30.0)
PRA LC/MS/MS: 0.451 ng/mL/hr (ref 0.167–5.380)

## 2020-07-25 NOTE — Progress Notes (Signed)
Patient was seen on 07/25/2020 for the first of a series of three diabetes self-management courses at [facility].  Patient Education Plan per assessed needs and concerns is to attend three course education program for Diabetes Self Management Education.  The following learning objectives were met by the patient during this class:  Describe diabetes, types of diabetes and pathophysiology  State some common risk factors for diabetes  Defines the role of glucose and insulin  Describe the relationship between diabetes and cardiovascular and other risks  State the members of the Healthcare Team  States the rationale for glucose monitoring and when to test  State their individual [place] the importance of logging glucose readings and how to interpret the readings  Identifies A1C target  Explain the correlation between A1c and eAG values  State symptoms and treatment of high blood glucose and low blood glucose  Explain proper technique for glucose testing and identify proper sharps disposal  Handouts given during class include:  How to Thrive:  A Guide for Your Journey with Diabetes by the ADA  Meal Plan Card and carbohydrate content list  Dietary intake form  Low Sodium Flavoring Tips  Types of Fats  Dining Out  Label reading  Snack list  Planning a balanced meal  The diabetes portion plate  Diabetes Resources  A1c to eAG Conversion Chart  Blood Glucose Log  Diabetes Recommended Care Schedule  Support Group  Diabetes Success Plan  Core Class Satisfaction Survey   Follow-Up Plan:  Attend core 2

## 2020-08-01 ENCOUNTER — Other Ambulatory Visit: Payer: Self-pay

## 2020-08-01 ENCOUNTER — Encounter: Payer: 59 | Attending: Student | Admitting: Dietician

## 2020-08-01 ENCOUNTER — Encounter: Payer: Self-pay | Admitting: Dietician

## 2020-08-01 DIAGNOSIS — E119 Type 2 diabetes mellitus without complications: Secondary | ICD-10-CM | POA: Insufficient documentation

## 2020-08-01 NOTE — Progress Notes (Signed)
Patient was seen on 08/01/2020 for the second of a series of three diabetes self-management courses at [facility]. The following learning objectives were met by the patient during this class:   Describe the role of different macronutrients on glucose  Explain how carbohydrates affect blood glucose  State what foods contain the most carbohydrates  Demonstrate carbohydrate counting  Demonstrate how to read Nutrition Facts food label  Describe effects of various fats on heart health  Describe the importance of good nutrition for health and healthy eating strategies  Describe techniques for managing your shopping, cooking and meal planning  List strategies to follow meal plan when dining out  Describe the effects of alcohol on glucose and how to use it safely  Goals:  Follow Diabetes Meal Plan as instructed  Aim to spread carbs evenly throughout the day  Aim for 3 meals per day and snacks as needed Include lean protein foods to meals/snacks  Monitor glucose levels as instructed by your doctor   Follow-Up Plan:  Attend Core 3  Work towards following your personal food plan.   

## 2020-08-08 ENCOUNTER — Other Ambulatory Visit: Payer: Self-pay

## 2020-08-13 ENCOUNTER — Other Ambulatory Visit: Payer: Self-pay

## 2020-08-13 ENCOUNTER — Ambulatory Visit (INDEPENDENT_AMBULATORY_CARE_PROVIDER_SITE_OTHER): Payer: 59 | Admitting: Family

## 2020-08-13 ENCOUNTER — Encounter: Payer: Self-pay | Admitting: Family

## 2020-08-13 VITALS — BP 122/82 | HR 93 | Temp 97.9°F | Resp 17 | Ht 62.0 in | Wt 189.0 lb

## 2020-08-13 DIAGNOSIS — Z720 Tobacco use: Secondary | ICD-10-CM | POA: Diagnosis not present

## 2020-08-13 DIAGNOSIS — I10 Essential (primary) hypertension: Secondary | ICD-10-CM | POA: Diagnosis not present

## 2020-08-13 DIAGNOSIS — L0390 Cellulitis, unspecified: Secondary | ICD-10-CM

## 2020-08-13 DIAGNOSIS — E119 Type 2 diabetes mellitus without complications: Secondary | ICD-10-CM | POA: Diagnosis not present

## 2020-08-13 MED ORDER — METFORMIN HCL 500 MG PO TABS: 500.0000 mg | ORAL_TABLET | Freq: Two times a day (BID) | ORAL | 0 refills | Status: DC

## 2020-08-13 MED ORDER — DOXYCYCLINE HYCLATE 100 MG PO TABS: 100.0000 mg | ORAL_TABLET | Freq: Two times a day (BID) | ORAL | 0 refills | Status: DC

## 2020-08-13 NOTE — Patient Instructions (Addendum)
Please stop the Januvia for now;  Call your gynecologist to let him know about the wound infection; start the Doxycycline 100 mg twice a day for 7 days;
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
## 2020-08-14 ENCOUNTER — Other Ambulatory Visit: Payer: Self-pay | Admitting: Family

## 2020-08-14 LAB — CBC WITH DIFFERENTIAL/PLATELET
Basophils Absolute: 0 10*3/uL (ref 0.0–0.1)
Basophils Relative: 1 % (ref 0.0–3.0)
Eosinophils Absolute: 0.1 10*3/uL (ref 0.0–0.7)
Eosinophils Relative: 1.9 % (ref 0.0–5.0)
HCT: 40.2 % (ref 36.0–46.0)
Hemoglobin: 13.5 g/dL (ref 12.0–15.0)
Lymphocytes Relative: 47.2 % — ABNORMAL HIGH (ref 12.0–46.0)
Lymphs Abs: 2 10*3/uL (ref 0.7–4.0)
MCHC: 33.5 g/dL (ref 30.0–36.0)
MCV: 84.7 fl (ref 78.0–100.0)
Monocytes Absolute: 0.4 10*3/uL (ref 0.1–1.0)
Monocytes Relative: 9.2 % (ref 3.0–12.0)
Neutro Abs: 1.7 10*3/uL (ref 1.4–7.7)
Neutrophils Relative %: 40.7 % — ABNORMAL LOW (ref 43.0–77.0)
Platelets: 243 10*3/uL (ref 150.0–400.0)
RBC: 4.75 Mil/uL (ref 3.87–5.11)
RDW: 13.1 % (ref 11.5–15.5)
WBC: 4.3 10*3/uL (ref 4.0–10.5)

## 2020-08-14 LAB — COMPREHENSIVE METABOLIC PANEL
ALT: 48 U/L — ABNORMAL HIGH (ref 0–35)
AST: 52 U/L — ABNORMAL HIGH (ref 0–37)
Albumin: 4.3 g/dL (ref 3.5–5.2)
Alkaline Phosphatase: 59 U/L (ref 39–117)
BUN: 12 mg/dL (ref 6–23)
CO2: 28 mEq/L (ref 19–32)
Calcium: 9.8 mg/dL (ref 8.4–10.5)
Chloride: 102 mEq/L (ref 96–112)
Creatinine, Ser: 0.86 mg/dL (ref 0.40–1.20)
GFR: 79.46 mL/min (ref >60.00)
Glucose, Bld: 76 mg/dL (ref 70–99)
Potassium: 3.3 mEq/L — ABNORMAL LOW (ref 3.5–5.1)
Sodium: 140 mEq/L (ref 135–145)
Total Bilirubin: 0.6 mg/dL (ref 0.2–1.2)
Total Protein: 7.6 g/dL (ref 6.0–8.3)

## 2020-08-14 LAB — HEMOGLOBIN A1C: Hgb A1c MFr Bld: 6.8 % — ABNORMAL HIGH (ref 4.6–6.5)

## 2020-08-14 MED ORDER — POTASSIUM CHLORIDE CRYS ER 20 MEQ PO TBCR: 20.0000 meq | EXTENDED_RELEASE_TABLET | Freq: Every day | ORAL | 0 refills | Status: DC

## 2020-08-15 ENCOUNTER — Other Ambulatory Visit: Payer: Self-pay | Admitting: Family

## 2020-08-15 DIAGNOSIS — R7989 Other specified abnormal findings of blood chemistry: Secondary | ICD-10-CM

## 2020-08-28 ENCOUNTER — Other Ambulatory Visit: Payer: Self-pay

## 2020-08-28 ENCOUNTER — Other Ambulatory Visit (INDEPENDENT_AMBULATORY_CARE_PROVIDER_SITE_OTHER): Payer: 59

## 2020-08-28 DIAGNOSIS — R7989 Other specified abnormal findings of blood chemistry: Secondary | ICD-10-CM

## 2020-08-30 LAB — COMPREHENSIVE METABOLIC PANEL
ALT: 72 U/L — ABNORMAL HIGH (ref 0–35)
AST: 85 U/L — ABNORMAL HIGH (ref 0–37)
Albumin: 4.2 g/dL (ref 3.5–5.2)
Alkaline Phosphatase: 66 U/L (ref 39–117)
BUN: 14 mg/dL (ref 6–23)
CO2: 26 mEq/L (ref 19–32)
Calcium: 9.9 mg/dL (ref 8.4–10.5)
Chloride: 102 mEq/L (ref 96–112)
Creatinine, Ser: 0.92 mg/dL (ref 0.40–1.20)
GFR: 73.26 mL/min (ref >60.00)
Glucose, Bld: 124 mg/dL — ABNORMAL HIGH (ref 70–99)
Potassium: 3.4 mEq/L — ABNORMAL LOW (ref 3.5–5.1)
Sodium: 140 mEq/L (ref 135–145)
Total Bilirubin: 0.6 mg/dL (ref 0.2–1.2)
Total Protein: 7.6 g/dL (ref 6.0–8.3)

## 2020-09-02 ENCOUNTER — Other Ambulatory Visit: Payer: Self-pay | Admitting: Family

## 2020-09-02 DIAGNOSIS — R7989 Other specified abnormal findings of blood chemistry: Secondary | ICD-10-CM

## 2020-09-09 ENCOUNTER — Telehealth: Payer: Self-pay | Admitting: *Deleted

## 2020-09-09 NOTE — Telephone Encounter (Signed)
FYI Called patient about her lab results.  She asked if Mickel Baas would be able to complete an accomodation form to work from home for the rest of the year since she is high risk, now after her surgery she has high blood pressure, has diabetes, and has not taken the covid vaccines (she does not want to take).  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  She will call and make an appt.

## 2020-09-09 NOTE — Telephone Encounter (Signed)
Patient has not been called to schedule her [place].  The order was put in on 09/02/20. Should the patient call them?

## 2020-09-09 NOTE — Telephone Encounter (Signed)
-----   Message from Marrian Salvage, [place] sent at 09/09/2020  2:15 PM EDT ----- Did not look at [place]; please call to go over results with her.

## 2020-09-10 NOTE — Telephone Encounter (Signed)
Left detailed message on machine for patient to call the radiology dept to schedule [place].

## 2020-09-10 NOTE — Telephone Encounter (Signed)
I have called the pt back and relayed provider message above.

## 2020-09-16 ENCOUNTER — Telehealth: Payer: Self-pay | Admitting: Family

## 2020-09-16 NOTE — Telephone Encounter (Signed)
Pt dropped off document to be filled out by provider ( 6 pages from [employer] [place]) Pt would like to have document to be faxed to [phone] when ready and also to have a copy for pt to pick up (pt tel [phone]). Document put at front office tray under providers name.

## 2020-09-16 NOTE — Telephone Encounter (Signed)
I have completed what I could of the forms and placed it in the red to be signed folder for provider part.

## 2020-09-17 NOTE — Telephone Encounter (Signed)
The fmla forms have been completed and faxed. I have placed a copy in scan and have a copy ready for pt to be picked up. I have attempted to call pt to let her know with no success.   I will try again. Paperwork is currently on my desk in the yellow my to do's folder.

## 2020-09-19 NOTE — Telephone Encounter (Signed)
I have attempted to call pt once again to inform her that her FMLA forms have been completed and faxed. No answer and unable to leave VM. Also a copy is ready for pick up.

## 2020-09-23 NOTE — Telephone Encounter (Signed)
Noted  

## 2020-09-26 ENCOUNTER — Ambulatory Visit
Admission: RE | Admit: 2020-09-26 | Discharge: 2020-09-26 | Disposition: A | Discharge status: Home / Self Care | Payer: 59 | Source: Ambulatory Visit | Attending: Family | Admitting: Family

## 2020-09-26 DIAGNOSIS — R7989 Other specified abnormal findings of blood chemistry: Secondary | ICD-10-CM

## 2020-09-27 ENCOUNTER — Other Ambulatory Visit: Payer: Self-pay | Admitting: Family

## 2020-09-27 DIAGNOSIS — R7989 Other specified abnormal findings of blood chemistry: Secondary | ICD-10-CM

## 2020-09-27 NOTE — Progress Notes (Signed)
I have called the pt and there was no answer and unable to leave message.

## 2020-09-27 NOTE — Progress Notes (Signed)
Pt has called back and I have relayed the [place] results and informed her to walk in to get labs at [facility]. Pt stated understanding.

## 2020-10-04 ENCOUNTER — Other Ambulatory Visit (INDEPENDENT_AMBULATORY_CARE_PROVIDER_SITE_OTHER): Payer: 59

## 2020-10-04 ENCOUNTER — Other Ambulatory Visit: Payer: Self-pay

## 2020-10-04 DIAGNOSIS — R7989 Other specified abnormal findings of blood chemistry: Secondary | ICD-10-CM | POA: Diagnosis not present

## 2020-10-04 NOTE — Addendum Note (Signed)
Addended by: Manuela Schwartz on: 10/04/2020 03:23 PM   Modules accepted: Orders

## 2020-10-08 ENCOUNTER — Other Ambulatory Visit: Payer: Self-pay | Admitting: Family

## 2020-10-09 NOTE — Addendum Note (Signed)
Addended by: Kelle Darting A on: 10/09/2020 12:07 PM   Modules accepted: Orders

## 2020-10-10 LAB — HEPATITIS PANEL(REFL)
HEPATITIS C ANTIBODY REFILL$(REFL): REACTIVE — AB
Hep B Core Total Ab: NONREACTIVE
Hep B S Ab: REACTIVE — AB
Hepatitis A AB,Total: NONREACTIVE
Hepatitis B Surface Ag: NONREACTIVE
SIGNAL TO CUT-OFF: 27.8 — ABNORMAL HIGH (ref <1.00)

## 2020-10-10 LAB — HCV RNA,QUANTITATIVE REAL TIME PCR
HCV Quantitative Log: 6.63 Log IU/mL — ABNORMAL HIGH
HCV RNA, PCR, QN: 4260000 IU/mL — ABNORMAL HIGH

## 2020-10-10 LAB — HEPATIC FUNCTION PANEL
AG Ratio: 1.3 (calc) (ref 1.0–2.5)
ALT: 38 U/L — ABNORMAL HIGH (ref 6–29)
AST: 60 U/L — ABNORMAL HIGH (ref 10–35)
Albumin: 4 g/dL (ref 3.6–5.1)
Alkaline phosphatase (APISO): 75 U/L (ref 31–125)
Bilirubin, Direct: 0.2 mg/dL (ref 0.0–0.2)
Globulin: 3 g/dL (calc) (ref 1.9–3.7)
Indirect Bilirubin: 0.5 mg/dL (calc) (ref 0.2–1.2)
Total Bilirubin: 0.7 mg/dL (ref 0.2–1.2)
Total Protein: 7 g/dL (ref 6.1–8.1)

## 2020-10-10 LAB — REFLEX TIQ

## 2020-10-13 ENCOUNTER — Other Ambulatory Visit: Payer: Self-pay | Admitting: Family

## 2020-10-15 ENCOUNTER — Other Ambulatory Visit: Payer: Self-pay | Admitting: Family

## 2020-10-15 DIAGNOSIS — B1920 Unspecified viral hepatitis C without hepatic coma: Secondary | ICD-10-CM

## 2020-10-21 ENCOUNTER — Other Ambulatory Visit: Payer: Self-pay | Admitting: Family

## 2020-10-22 ENCOUNTER — Telehealth: Payer: Self-pay

## 2020-10-22 ENCOUNTER — Other Ambulatory Visit (HOSPITAL_COMMUNITY): Payer: Self-pay

## 2020-10-22 MED ORDER — METFORMIN HCL 500 MG PO TABS: 500.0000 mg | ORAL_TABLET | Freq: Two times a day (BID) | ORAL | 0 refills | Status: DC

## 2020-10-22 NOTE — Telephone Encounter (Signed)
[facility] Patient Teacher, English as a foreign language completed.    The patient is insured through [employer].  Medication will require a PA .  We will continue to follow to see if copay assistance is needed.  Ileene Patrick, [facility] Patient [facility] Phone: [phone] Fax:  [phone]

## 2020-10-24 ENCOUNTER — Encounter: Payer: Self-pay | Admitting: Infectious Diseases

## 2020-10-24 ENCOUNTER — Ambulatory Visit (INDEPENDENT_AMBULATORY_CARE_PROVIDER_SITE_OTHER): Payer: 59 | Admitting: Infectious Diseases

## 2020-10-24 ENCOUNTER — Other Ambulatory Visit: Payer: Self-pay

## 2020-10-24 VITALS — BP 116/84 | HR 76 | Temp 98.3°F | Wt 189.6 lb

## 2020-10-24 DIAGNOSIS — B182 Chronic viral hepatitis C: Secondary | ICD-10-CM

## 2020-10-24 HISTORY — DX: Chronic viral hepatitis C: B18.2

## 2020-10-24 NOTE — Patient Instructions (Addendum)
Hepatitis C antibody blood test -->> for your family who needs testing. [employer] can do this test for free.    Nice to meet you today!    We need to get a little more information about your hepatitis c infection before we start your treatment. I anticipate that we can get you started in a few weeks after we submit approval to your insurance to ensure payment. We may need to place referral for an ultrasound and/or gastroenterology if your blood work indicates more damage to the liver than expected.     ABOUT HEPATITIS C VIRUS:  Chronic Hepatitis C is the most common blood-borne infection in the [place], affecting approximately 3 million people.  It is the leading cause of cirrhosis, liver cancer, and end stage liver disease requiring transplantation when this infection goes untreated for many years  The majority of people who are infected are unaware because there are not many early symptoms that are specific to this and often go undiagnosed until a specific blood test is drawn.   The hepatitis c virus is passed primarily through direct exposure of contaminated blood or body fluids. It is most efficiently transmitted through repeated exposure to infected blood.  Risk for sexual transmission is very low but is possible if there is high frequency of unprotected sexual activity with known hepatitis c partner or multiple partners of known status.  Over time, approximately 60-70% of people can develop some degree of liver disease. Cirrhosis occurs in 10-20% of those with chronic infection. 1-5% will get liver cancer, which has a very high rate of death.   Approximately 15-25% clear the infection without medication (usually in the first 6 months of becoming exposed to virus)  Newer medications provide over 95% cure rate when taken as prescribed    IN GENERAL ABOUT DIET  Persons living with chronic hepatitis c infection should eat a diet to maintain a healthy  weight and avoid nutritional deficiencies.   Completely avoiding alcohol is the best decision for your liver health. If unable to do so please limit alcohol to as little as possible to less than 1 standard drink a day - this is very irritating to your liver.  Limit tylenol use to less than 2,000 mg daily (two extra strength tablets only twice a day)  If you have cirrhosis of the liver please take no more than 1,000 mg tylenol a day  Patients with cirrhosis should not have protein restriction; we recommend a protein intake of approximately 1.2-1.5 g/kg/day.   For patients with cirrhosis and hepatic encephalopathy, the American Association for the Study of Liver Diseases (AASLD) recommended protein intake is 1.2-1.5 g/kg/day.  If you experience ascites (fluid accumulation in the abdomen associated with severe liver damage / cirrhosis) please limit sodium intake to < 2000 mg a day    UNTIL YOU HAVE BEEN TREATED AND CURED:  Use condoms with all sexual encounters or practice abstinence to avoid sexual transmission   No sharing of razors, toothbrushes, nail clippers or anything that could potentially have blood on it.   If you cut yourself please clean and cover any wounds or open sores to others do not come into contact with your blood.   If blood spills onto item/surface please clean with 1:10 bleach solution and allow to dry, EVEN if it is dried blood.    GENERAL HELPFUL HINTS ON HCV THERAPY:  1. Stay well-hydrated.  2. Notify the [facility] of any changes in your  other over-the-counter/herbal or prescription medications.  3. If you miss a dose of your medication, take the missed dose as soon as you remember. Return to your regular time/dose schedule the next day.   4.  Do not stop taking your medications without first talking with your healthcare provider.  5.  You will see our pharmacist-specialist within the first 2 weeks of starting your medication to monitor for any possible  side effects.  6.  You will have blood work once during treatment 4 weeks after your first pill. Again soon after treatment is completed and one final lab 3 months after your last pill to ensure cure!   TIPS TO BE SUCCESSFUL WITH DAILY MEDICATION USE:  1. Set a reminder on your phone  2. Try filling out a pill box for the week - pick a day and put one pill for every day during the week so you know right away if you missed a pill.   3. Have a trusted family member ask you about your medications.   4. Smartphone app    Medication we would like to use for you will be : hopeful for a one pill once a day for 8 weeks - will see what your blood work shows when it returns.     Harvoni Instructions:  Take Harvoni, one tablet daily with or without food. You should take it at approximately the same time every day. Treatment will be for 8 weeks. Do not miss a dose.    Do not run out of Harvoni! If you are down to one week of medication left and have not heard about your next shipment, please let us know as soon as possible. Please store at room temperature.   If you need to start a new medication, prescription from your doctor or over the counter medication, you need to contact us to make sure it does not interfere with Harvoni. There are several medications that can interfere with Harvoni and can make you sick or make the medication not work.  If you need to take a medication for acid reflux, your choices are as follows: TUMS or Rolaids separated at least four hours from [place].  Pepcid (famotidine) '40mg'$  up to twice per day administered with Harvoni or 12 hours apart from [place].  Omeprazole '20mg'$  with Harvoni on EMPTY stomach.   Tylenol (acetominophen) and Advil (ibuprofen) are safe to take with Harvoni if needed for headache, fever, pain.   DO NOT stop Harvoni unless instructed to by your provider. If you are hospitalized while taking this medication please bring it with you to the  hospital to avoid interruption of therapy. Every pill is important!  The most common side effects associated with Harvoni include:  Fatigue Headache Nausea Diarrhea Insomnia

## 2020-10-24 NOTE — Assessment & Plan Note (Signed)
New Patient with Chronic Hepatitis C genotype currently unknown, treatment naive. I suspect she may have acquired infection through unregulated tattoos. I suggested her family that also received tattoos in this setting have their Ab checked to rule out infection. I do not suspect Angel Tucker has had this infection for many years and feel this is an early catch for her given risk factors discussed.   Fibrosis risk score with FIB4 of 1.96 and APRI 0.45 are all statistically low for severe fibrosis. Liver ultrasound normal. Will get genotype and submit request to insurance for simple single tablet regimen - hopeful we can do harvoni for 8 weeks given VL < 6 million and HIV negative patient.   I discussed with the patient the lab findings that confirm chronic hepatitis C as well as the natural history and progression of disease including about 30% of people who develop cirrhosis of the liver if left untreated and once cirrhosis is established there is a 2-7% risk per year of liver cancer and liver failure.  I discussed the importance of treatment and benefits in reducing the risk, even if significant liver fibrosis exists. I also discussed risk for re-infection following treatment should he not continue to modify risk factors.    Patient counseled extensively on limiting acetaminophen to no more than 2 grams daily, avoidance of alcohol.  Transmission discussed with patient including sexual transmission, sharing razors and toothbrush.   No risk for cirrhosis  Will prescribe appropriate medication based on genotype and coverage - hopeful for Harvoni as above   She has immunity to hepatitis B   She will consider hepatitis A and pneumovax at upcoming visits. Should get pneumonia vaccine with diabetes dx also.    Once we have information back to submit for prior authorization to insurance we will call Angel Tucker and counsel over the phone re: new medication, administration and s/e. She will return 4 weeks  after she starts to assess adherence, tolerability and therapeutic drug monitoring.
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
## 2020-10-29 LAB — HEPATITIS C GENOTYPE

## 2020-10-30 NOTE — Telephone Encounter (Signed)
Received a call regarding FMLA from patient's employer needing additional information. She will be sending over a form with the questions. They need the form back by Friday.

## 2020-10-31 NOTE — Telephone Encounter (Signed)
I have spoken to the pt and informed her that we were supposed to get paperwork from her HR department for additional information. I have not received the paperwork as of yet. Pt will call/ e-mail her HR Dept to find out what the additional information is. Pt does have a copy of the accomodation forms.   Thanks

## 2020-10-31 NOTE — Telephone Encounter (Signed)
I have called pt and relayed the message that we have not received any additional paperwork regarding FMLA and if it needs to be submitted by Friday it will not be able to be done. Message was left on identified VM.

## 2020-11-01 ENCOUNTER — Telehealth: Payer: Self-pay | Admitting: Family

## 2020-11-01 NOTE — Telephone Encounter (Signed)
Patient states her HR dept faxed over a medical accomodation form on 08/04 for Angel Tucker to fill out that states she should be working from home. I did inform her that forms take  5-7 days,. Pt insisted that she get a cb from [place] because she needed forms asap.

## 2020-11-01 NOTE — Telephone Encounter (Signed)
I have called the pt back and there was no answer so I left a message to call back. We have not seen any additional accommodation forms or paperwork about additional information that is needed. I left a voice mail stating that.   Also SEE Encounter from 09/16/20. Which has more documentation about the Accomodation forms.

## 2020-11-01 NOTE — Telephone Encounter (Signed)
Pt has called back and she stated that her HR representative Elmo Putt, [phone], has a few questions to help extend her accomodation request to continue to work from home.   I have informed her that I have not seen the paperwork with the questions and Mickel Baas is going to out of the office until Friday of next week. Pt stated that she gives me verbal consent to discuss her medical information to her HR for work purposes.    Pt stated that she will call back Monday on a conference call with Wynelle Link so the questions can be answered verbally.   Question: 1- Medical necessary for accommodations ~ Heal from Surgery ~new dex of Hep C., and undergoing treatment.  2- Medical benefit for accommodations ~to help her heal property ~to reduce/ limit risk of secondary infection while undergoing treatment for Hep C.  3.Duration of accommodations ~ until the end of this year

## 2020-11-04 ENCOUNTER — Other Ambulatory Visit (HOSPITAL_COMMUNITY): Payer: Self-pay

## 2020-11-04 ENCOUNTER — Telehealth: Payer: Self-pay

## 2020-11-04 NOTE — Telephone Encounter (Signed)
[facility] Patient Advocate Encounter   Received notification from [place] that prior authorization for Harvoni is required.   PA submitted on 11/04/20 Key [id number] Status is pending    [facility] will continue to follow.   Ileene Patrick, [facility] Patient [facility] for Infectious Disease Phone: [phone] Fax:  [phone]

## 2020-11-04 NOTE — Telephone Encounter (Signed)
I have called pt back today to see if she get in contact with her HR representative to do a conference call. She tried to give her a call with me on the other line and it didn't work since she did not pick up. She will try again tomorrow.

## 2020-11-04 NOTE — Progress Notes (Signed)
I will start a PA

## 2020-11-05 NOTE — Telephone Encounter (Signed)
Pt has called back to see if we have received the additional questions; and I have not. I have informed her that I do have some time to talk to her HR Rep if she can get her on the line.   There was a 3 way/ conference call conducted with Haze Rushing, and myself. Samaa verbally consented that I can disclose her medical information to the HR rep from [employer], pt's employer.   I have answered the 3 questions that were answered by the provider in the past and Haliey stated that it would take up to 3 days for the accommodation request determination. The chances are high for the approval.

## 2020-11-12 ENCOUNTER — Other Ambulatory Visit (HOSPITAL_COMMUNITY): Payer: Self-pay

## 2020-11-12 ENCOUNTER — Telehealth: Payer: Self-pay

## 2020-11-12 NOTE — Telephone Encounter (Addendum)
[facility] Patient Advocate Encounter  Prior Authorization for [facility] has been approved.     Effective dates: 11/07/20 through 01/02/21  PA is approved for Brand Name Harvoni Only.  [facility]         Once script is sent over I will follow up to see if the patient will need copay assistance.   [facility] will continue to follow.  Ileene Patrick, [facility] Patient [facility] for Infectious Disease Phone: [phone] Fax:  [phone]

## 2020-11-14 ENCOUNTER — Other Ambulatory Visit: Payer: Self-pay | Admitting: Pharmacist

## 2020-11-14 DIAGNOSIS — B182 Chronic viral hepatitis C: Secondary | ICD-10-CM

## 2020-11-14 MED ORDER — HARVONI 90-400 MG PO TABS: 1.0000 | ORAL_TABLET | Freq: Every day | ORAL | 1 refills | Status: DC

## 2020-11-14 NOTE — Progress Notes (Signed)
Patient's insurance requires Harvoni to be filled at [facility]. Sending Rx now. Butch Penny will call patient and coordinate.

## 2020-11-21 ENCOUNTER — Other Ambulatory Visit: Payer: Self-pay

## 2020-11-21 ENCOUNTER — Encounter: Payer: 59 | Attending: Student | Admitting: Dietician

## 2020-11-21 ENCOUNTER — Encounter: Payer: Self-pay | Admitting: Dietician

## 2020-11-21 VITALS — Ht 63.0 in | Wt 189.5 lb

## 2020-11-21 DIAGNOSIS — E119 Type 2 diabetes mellitus without complications: Secondary | ICD-10-CM | POA: Diagnosis present

## 2020-11-21 NOTE — Progress Notes (Signed)
Patient attended Diabetes Core Classes 1-2 between 07/25/2020 and 08/01/2020 at [facility]. The purpose of the meeting today is to review information learned during those classes as well as review patient application and goals.   What are one or two positive things that you are doing right now to manage your diabetes?  Pt is eating more fruit and eating pre-portioned rice cups to control their carbs. Pt has started walking again as well.  What is the hardest part about your diabetes right now, causing you the most concern, or is the most worrisome to you about your diabetes?  Keeping away from the sweets. Pt states that since they can't have them, they want them more. Pt reports their cravings are strongest right after they get off work in the evening  What questions do you have today?  No specific questions  Have you participated in any diabetes support group?  No  History:  Diabetes, HTN A1C:  6.8 (Unchanged since referral) Medications include:  metformin Sleep:  N/A Weight:  189.5 lbs Blood Glucose:  Fasting:  N/A   2 hours after starting a meal:  N/A  Have you had any low blood sugar readings in the past month?  No, had a couple highs over 200  Social History:  Pt reports things are better than anticipated, pt was upset upon first diagnosis but is feeling more optimistic now.  Pt works from home and will not eat all day until around 7:00 PM when they get off. Pt reports eating whatever they want, but will eat it and then take their metformin right after. Pt is eating lots of fruit (watermelon, pineapple, grapes). Pt reports only checking blood suagr if they aren't feeling well (headache, sinus pressure). Pt will take their metformin and check their blood sugar when this happens. Pt reports BG will be over 200 when these symptoms arise. Pt started walking last week, walks about a half a mile on the weekend.    24 hour diet recall: Breakfast:   Coffee Snack:  None Lunch:  1.5 cups french fries, microwave steak and cheese, 2 popsicles, 4 oreo cookies Snack:  None Dinner:  None Snack:  None Beverages:  water, coffee   Specific focus but not limited to the following: Review of blood glucose monitoring and interpretation including the recommended target ranges and Hgb A1c.  Review of carbohydrate counting, importance of regularly scheduled meals/snacks, and meal planning to improve quality of diet. Review of the effects of physical activity on glucose levels and long-term glucose control.  Recommended goal of 150 minutes of physical activity/week. Review of patient medications and discussed role of medication on blood glucose and possible side effects. Discussion of strategies to manage stress, psychosocial issues, and other obstacles to diabetes management. Review of short-term complications: hyper- and hypo-glycemia (causes, symptoms, and treatment options) Review of prevention, detection, and treatment of long-term complications.  Discussion of the role of prolonged elevated glucose levels on body systems.  Continuing Goals: Have a piece of fruit with your coffee in the morning and a sandwich for lunch to help decrease your cravings in the evening. Increase the distance of your walks at your comfort level. This physical activity helps to lower your blood sugar. Check your fasting blood sugar every day. Look for numbers under 125!  E-mail your blood sugar log to me in the next few weeks, and bring your meter to our next appointment so I can view your readings.  Future Follow up:  3 months

## 2020-11-21 NOTE — Patient Instructions (Addendum)
Have a piece of fruit with your coffee in the morning and a sandwich for lunch to help decrease your cravings in the evening.  Increase the distance of your walks at your comfort level. This physical activity helps to lower your blood sugar.  Check your fasting blood sugar every day. Look for numbers under 125!  E-mail your blood sugar log to me in the next few weeks, and bring your meter to our next appointment so I can view your readings.

## 2020-12-25 ENCOUNTER — Encounter: Payer: Self-pay | Admitting: Infectious Diseases

## 2020-12-25 ENCOUNTER — Ambulatory Visit (INDEPENDENT_AMBULATORY_CARE_PROVIDER_SITE_OTHER): Payer: 59 | Admitting: Infectious Diseases

## 2020-12-25 ENCOUNTER — Other Ambulatory Visit: Payer: Self-pay

## 2020-12-25 DIAGNOSIS — B182 Chronic viral hepatitis C: Secondary | ICD-10-CM | POA: Diagnosis not present

## 2020-12-25 NOTE — Patient Instructions (Addendum)
Nice to see you today   Please continue your Harvoni everyday.   Give the pharmacy a call tomorrow to make sure you get your next pill box @ [phone]  Halfway through treatment!  Please call for an appointment with our pharmacy team in early November to follow up after you complete your medication.
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
## 2020-12-26 ENCOUNTER — Encounter: Payer: Self-pay | Admitting: Infectious Diseases

## 2020-12-26 NOTE — Assessment & Plan Note (Signed)
Tolerating harvoni well without side effects now 4 weeks into her 8 week treatment. I gave her the pharmacy number to call to ensure her next box is shipped out to her house to continue taking. It is OK if she takes her Harvoni a few hours earlier in the day if she plans to consume wine in the evening/night later that day. Would prefer earlier dose than a missed dose.  Will have her return in 5 weeks for end of treatment assessment. Will check hep c rna and LFTs at that time. Hep A  #1 and Prevnar to be given at that time. I will be out of the office so she will follow up with our CPP team.

## 2021-01-05 ENCOUNTER — Other Ambulatory Visit: Payer: Self-pay | Admitting: Family

## 2021-02-10 ENCOUNTER — Encounter: Payer: 59 | Admitting: Dietician

## 2021-02-18 ENCOUNTER — Ambulatory Visit (INDEPENDENT_AMBULATORY_CARE_PROVIDER_SITE_OTHER): Payer: 59 | Admitting: Pharmacist

## 2021-02-18 ENCOUNTER — Other Ambulatory Visit: Payer: Self-pay

## 2021-02-18 DIAGNOSIS — B182 Chronic viral hepatitis C: Secondary | ICD-10-CM
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
## 2021-02-20 LAB — HEPATIC FUNCTION PANEL
AG Ratio: 1.5 (calc) (ref 1.0–2.5)
ALT: 15 U/L (ref 6–29)
AST: 17 U/L (ref 10–35)
Albumin: 4.1 g/dL (ref 3.6–5.1)
Alkaline phosphatase (APISO): 80 U/L (ref 31–125)
Bilirubin, Direct: 0.1 mg/dL (ref 0.0–0.2)
Globulin: 2.8 g/dL (calc) (ref 1.9–3.7)
Indirect Bilirubin: 0.3 mg/dL (calc) (ref 0.2–1.2)
Total Bilirubin: 0.4 mg/dL (ref 0.2–1.2)
Total Protein: 6.9 g/dL (ref 6.1–8.1)

## 2021-02-20 LAB — HEPATITIS C RNA QUANTITATIVE
HCV Quantitative Log: <1.18 log IU/mL
HCV RNA, PCR, QN: <15 IU/mL

## 2021-02-26 ENCOUNTER — Telehealth: Payer: Self-pay | Admitting: Family

## 2021-02-26 NOTE — Telephone Encounter (Signed)
Pt would like to know if its okay to take 2 pills of the PARoxetine (PAXIL) 10 MG tablet [[id number]] today only, she's experiencing more problems then normal. Please advise.

## 2021-02-27 NOTE — Telephone Encounter (Signed)
Patient will discuss at virtual visit tomorrow

## 2021-02-27 NOTE — Telephone Encounter (Signed)
Will discuss at virtual visit tomorrow. I am not writing the Paxil for her so need to get more information.

## 2021-02-28 ENCOUNTER — Encounter: Payer: Self-pay | Admitting: Family

## 2021-02-28 ENCOUNTER — Telehealth (INDEPENDENT_AMBULATORY_CARE_PROVIDER_SITE_OTHER): Payer: 59 | Admitting: Family

## 2021-02-28 VITALS — Ht 63.0 in | Wt 200.0 lb

## 2021-02-28 DIAGNOSIS — F419 Anxiety disorder, unspecified: Secondary | ICD-10-CM

## 2021-02-28 DIAGNOSIS — E119 Type 2 diabetes mellitus without complications: Secondary | ICD-10-CM

## 2021-02-28 DIAGNOSIS — I10 Essential (primary) hypertension: Secondary | ICD-10-CM | POA: Diagnosis not present
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
## 2021-03-07 ENCOUNTER — Encounter: Payer: Self-pay | Admitting: Family

## 2021-03-07 NOTE — Telephone Encounter (Signed)
Patient stopped by the office to print documents needed to be filled out by Jodi Mourning. Documents pertain working from home for her job. Documents placed on Laura's box.

## 2021-03-07 NOTE — Telephone Encounter (Signed)
I have received the printed paperwork since the links did not open. I have given it to the provider to complete.

## 2021-03-12 ENCOUNTER — Ambulatory Visit: Payer: 59 | Admitting: Dietician

## 2021-03-21 ENCOUNTER — Encounter: Payer: Self-pay | Admitting: Family

## 2021-03-21 ENCOUNTER — Ambulatory Visit (INDEPENDENT_AMBULATORY_CARE_PROVIDER_SITE_OTHER): Payer: 59 | Admitting: Family

## 2021-03-21 VITALS — BP 130/82 | HR 84 | Temp 98.0°F | Ht 63.0 in | Wt 201.8 lb

## 2021-03-21 DIAGNOSIS — N951 Menopausal and female climacteric states: Secondary | ICD-10-CM

## 2021-03-21 DIAGNOSIS — I10 Essential (primary) hypertension: Secondary | ICD-10-CM | POA: Diagnosis not present

## 2021-03-21 DIAGNOSIS — E119 Type 2 diabetes mellitus without complications: Secondary | ICD-10-CM

## 2021-03-21 LAB — COMPREHENSIVE METABOLIC PANEL
ALT: 10 U/L (ref 0–35)
AST: 15 U/L (ref 0–37)
Albumin: 4.2 g/dL (ref 3.5–5.2)
Alkaline Phosphatase: 87 U/L (ref 39–117)
BUN: 9 mg/dL (ref 6–23)
CO2: 29 mEq/L (ref 19–32)
Calcium: 9.4 mg/dL (ref 8.4–10.5)
Chloride: 104 mEq/L (ref 96–112)
Creatinine, Ser: 0.95 mg/dL (ref 0.40–1.20)
GFR: 70.22 mL/min (ref >60.00)
Glucose, Bld: 172 mg/dL — ABNORMAL HIGH (ref 70–99)
Potassium: 3.8 mEq/L (ref 3.5–5.1)
Sodium: 141 mEq/L (ref 135–145)
Total Bilirubin: 0.4 mg/dL (ref 0.2–1.2)
Total Protein: 7.1 g/dL (ref 6.0–8.3)

## 2021-03-21 LAB — CBC WITH DIFFERENTIAL/PLATELET
Basophils Absolute: 0 10*3/uL (ref 0.0–0.1)
Basophils Relative: 0.5 % (ref 0.0–3.0)
Eosinophils Absolute: 0.1 10*3/uL (ref 0.0–0.7)
Eosinophils Relative: 1.4 % (ref 0.0–5.0)
HCT: 37.9 % (ref 36.0–46.0)
Hemoglobin: 12.4 g/dL (ref 12.0–15.0)
Lymphocytes Relative: 31.1 % (ref 12.0–46.0)
Lymphs Abs: 1.8 10*3/uL (ref 0.7–4.0)
MCHC: 32.6 g/dL (ref 30.0–36.0)
MCV: 86.3 fl (ref 78.0–100.0)
Monocytes Absolute: 0.3 10*3/uL (ref 0.1–1.0)
Monocytes Relative: 5.8 % (ref 3.0–12.0)
Neutro Abs: 3.5 10*3/uL (ref 1.4–7.7)
Neutrophils Relative %: 61.2 % (ref 43.0–77.0)
Platelets: 198 10*3/uL (ref 150.0–400.0)
RBC: 4.4 Mil/uL (ref 3.87–5.11)
RDW: 13.5 % (ref 11.5–15.5)
WBC: 5.8 10*3/uL (ref 4.0–10.5)

## 2021-03-21 LAB — HEMOGLOBIN A1C: Hgb A1c MFr Bld: 6.7 % — ABNORMAL HIGH (ref 4.6–6.5)

## 2021-03-21 MED ORDER — VENLAFAXINE HCL ER 37.5 MG PO CP24: ORAL_CAPSULE | ORAL | 1 refills | Status: DC

## 2021-03-21 MED ORDER — AMLODIPINE BESYLATE 10 MG PO TABS: 10.0000 mg | ORAL_TABLET | Freq: Every day | ORAL | 1 refills | Status: DC

## 2021-03-21 NOTE — Telephone Encounter (Signed)
Forms printed and placed on CMA's desk.
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
## 2021-03-21 NOTE — Patient Instructions (Signed)
To taper off Paxil:  Take 10 mg daily x 3 days and then 10 mg every other day x 3 days and then stop; Start Effexor XR ( Venlafaxine) at 37.5 mg for 1 week and then increase to 75 mg daily;

## 2021-03-25 ENCOUNTER — Encounter: Payer: Self-pay | Admitting: Family

## 2021-04-17 IMAGING — CT CT ABD-PELV W/ CM
2 of 5 series · 16 of 46 positions shown, 18 images · IV contrast (OMNIPAQUE 300)
Comparison: None.

CLINICAL DATA: Abdominal pain, primarily right cited

EXAM:
CT ABDOMEN AND PELVIS WITH CONTRAST
TECHNIQUE: Multidetector CT imaging of the abdomen and pelvis was performed
using the standard protocol following bolus administration of
intravenous contrast. Oral contrast also administered.
CONTRAST:  100mL OMNIPAQUE IOHEXOL 300 MG/ML  SOLN

[Series 2: abd/pel w · axial · 0.80mm/px · z∈[-448,-68]mm · 13 of 86 slices shown, 15 images]
[im 5/86  soft-tissue]
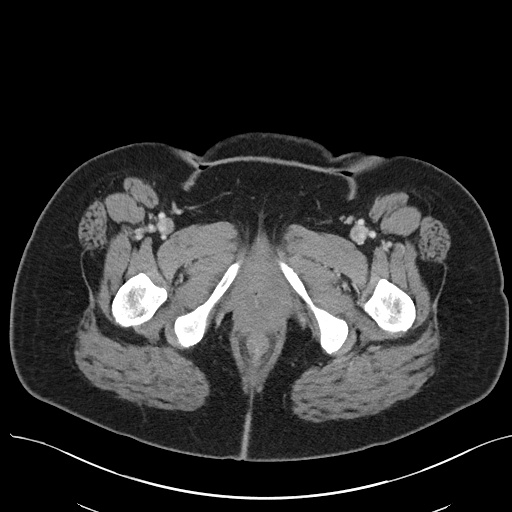
[im 5/86  bone]
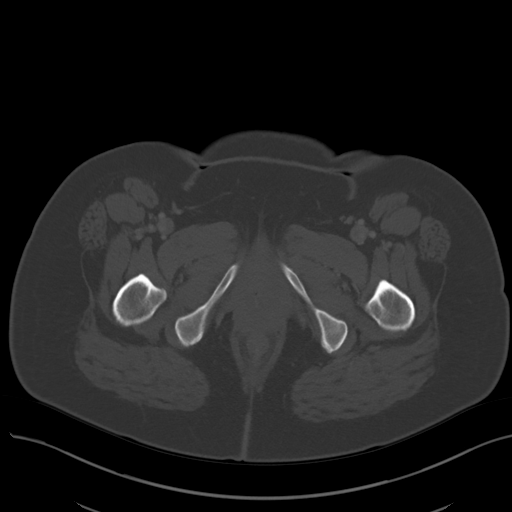
[im 10/86  soft-tissue]
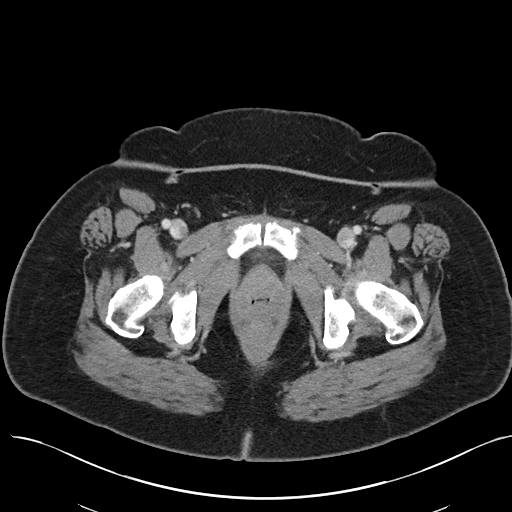
[im 19/86  soft-tissue]
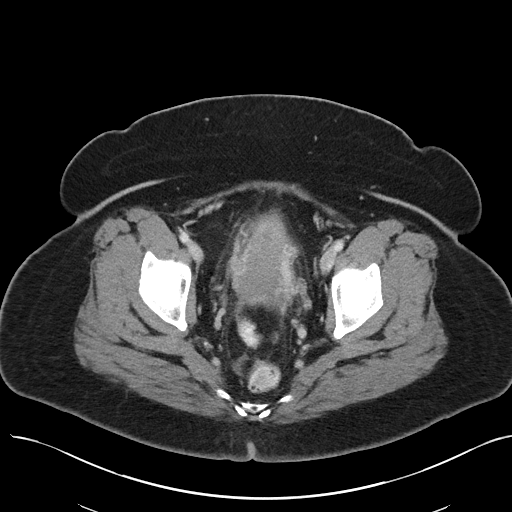
[im 24/86  soft-tissue]
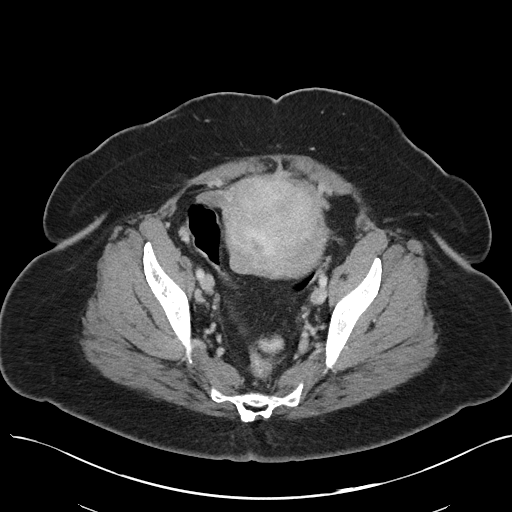
[im 29/86  soft-tissue]
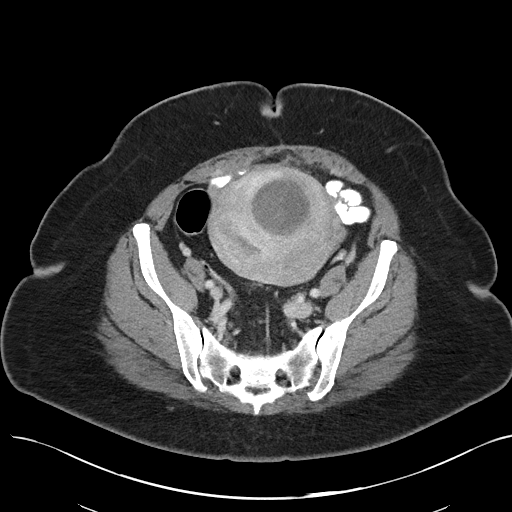
[im 38/86  soft-tissue]
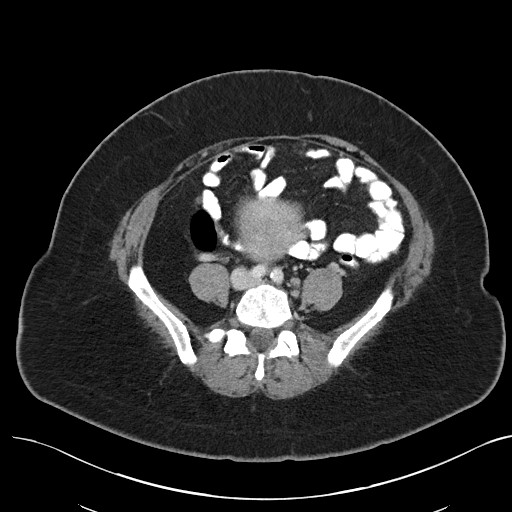
[im 43/86  soft-tissue]
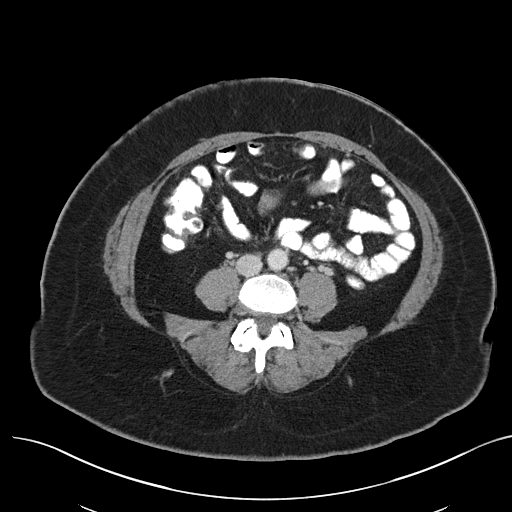
[im 48/86  soft-tissue]
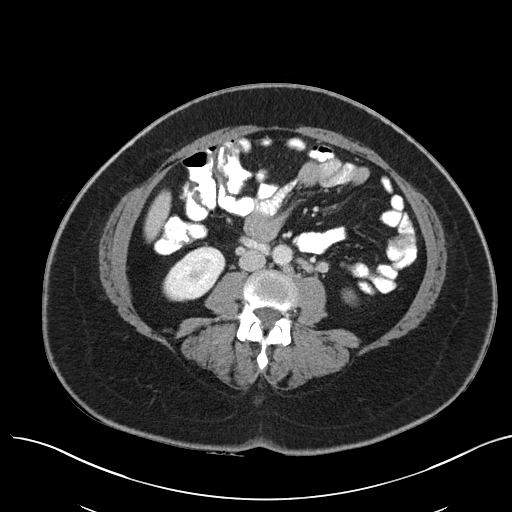
[im 57/86  soft-tissue]
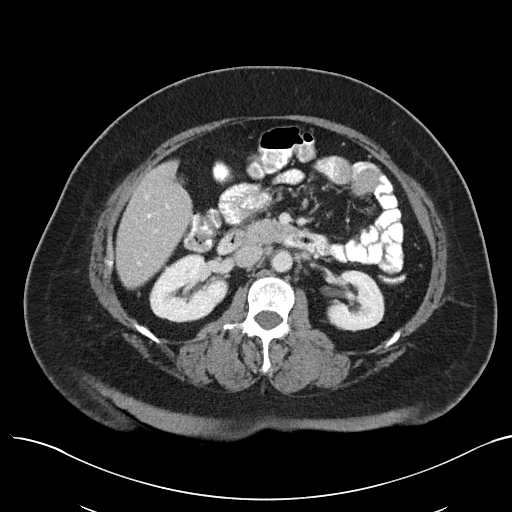
[im 57/86  bone]
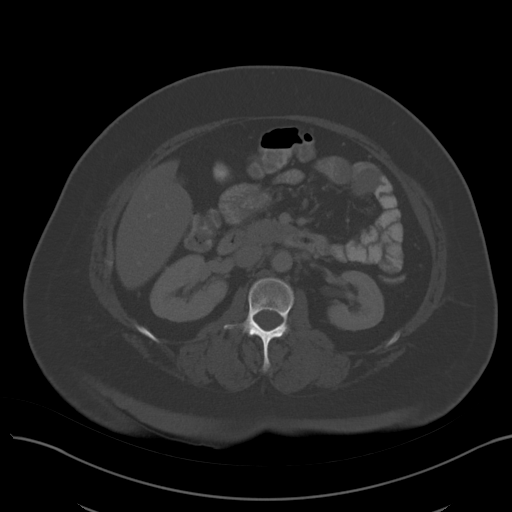
[im 62/86  soft-tissue]
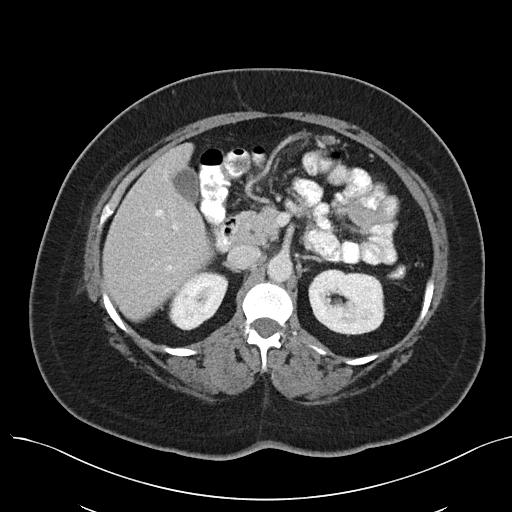
[im 67/86  soft-tissue]
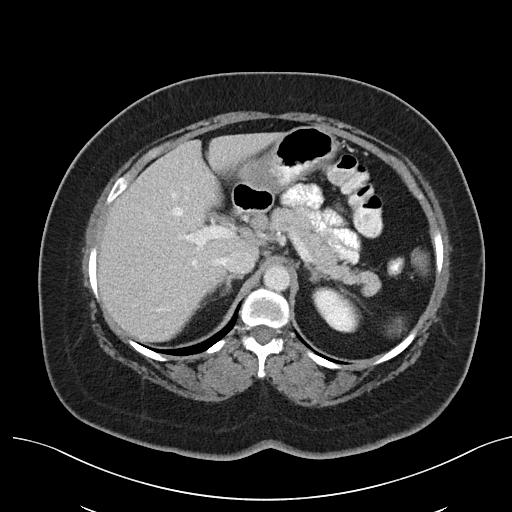
[im 76/86  soft-tissue]
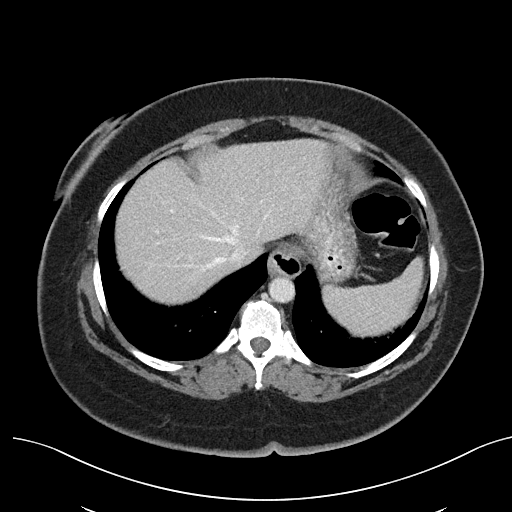
[im 81/86  soft-tissue]
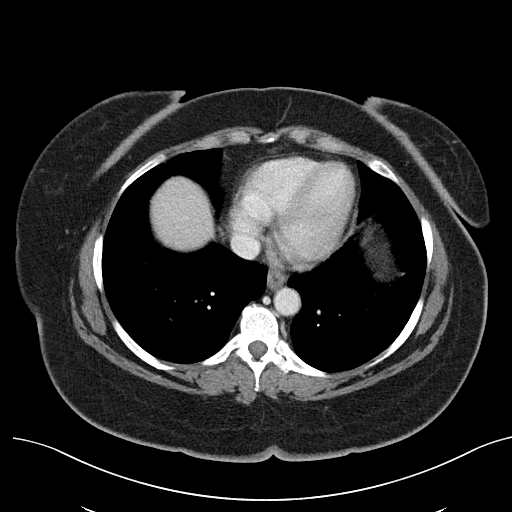

[Series 5: coronal st · coronal · 0.75mm/px · 3 of 107 slices shown]
[im 36/107  soft-tissue]
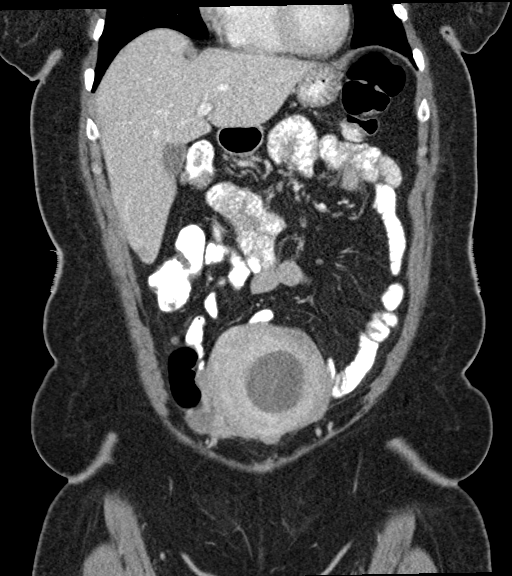
[im 48/107  soft-tissue]
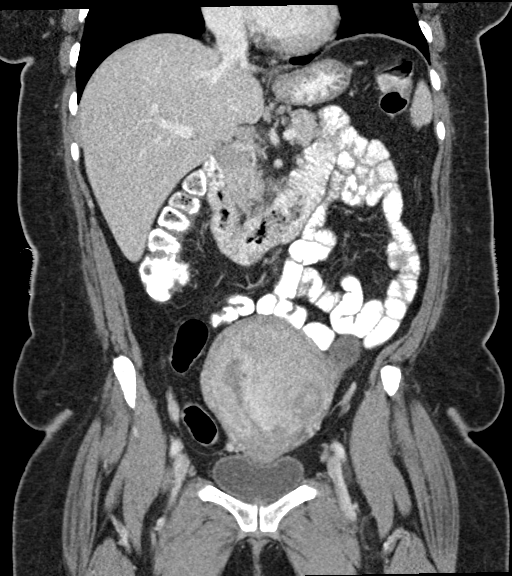
[im 59/107  soft-tissue]
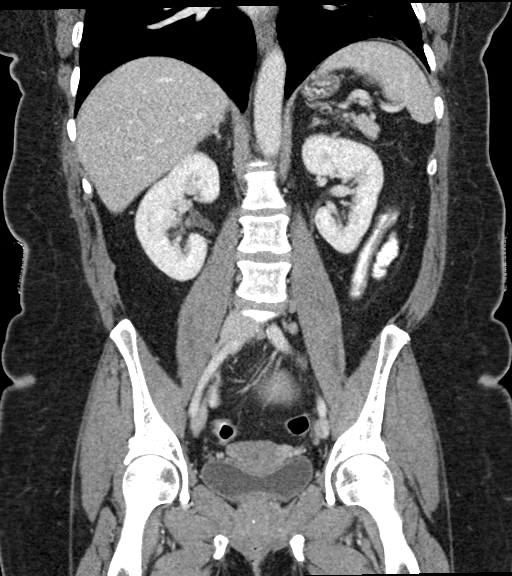

[16 of 46 positions shown; findings below may reference images not displayed]

FINDINGS: Lower chest: Lung bases are clear.  There is a small hiatal hernia.

Hepatobiliary: No focal liver lesions are evident. Gallbladder wall
is not appreciably thickened. There is no biliary duct dilatation.

Pancreas: There is no pancreatic mass or inflammatory focus.

Spleen: No splenic lesions are evident.

Adrenals/Urinary Tract: Adrenals bilaterally appear normal. There is
no demonstrable renal mass or hydronephrosis on either side. There
is a 2 mm calculus in the lower pole of the right kidney. There is
no appreciable ureteral calculus on either side. Urinary bladder is
midline with wall thickness within normal limits.

Stomach/Bowel: There is no appreciable bowel wall or mesenteric
thickening. No evident bowel obstruction. Terminal ileum appears
unremarkable. There is no appreciable free air or portal venous air.

Vascular/Lymphatic: There is no abdominal aortic aneurysm. There is
slight aortic atherosclerosis. Major venous structures appear
patent. There are subcentimeter retroperitoneal lymph nodes which do
not meet size criteria for pathologic significance. No enlarged
lymph nodes evident in the abdomen or pelvis.

Reproductive: Uterus is anteverted. The uterus is diffusely enlarged
with extensive inhomogeneity and mass lesions throughout the uterus.
The largest individual mass is present along the volar aspect of the
uterus measuring 4.7 x 4.7 cm. This mass displaces the endometrium
somewhat dorsally. No adnexal masses are identified.

Other: No appendiceal inflammation evident. No abscess or ascites in
the abdomen or pelvis.

Musculoskeletal: There are no blastic or lytic bone lesions. No
intramuscular or abdominal wall lesions evident.
IMPRESSION: 1.  Enlarged leiomyomatous uterus.

2. 2 mm nonobstructing calculus lower pole right kidney. No
hydronephrosis or ureteral calculus on either side. Urinary bladder
wall thickness normal.

3.  Small hiatal hernia.

4. No bowel wall thickening or bowel obstruction. No abscess in the
abdomen pelvis. No appendiceal region inflammation.

## 2021-04-18 ENCOUNTER — Encounter: Payer: Self-pay | Admitting: Family

## 2021-04-18 ENCOUNTER — Ambulatory Visit (INDEPENDENT_AMBULATORY_CARE_PROVIDER_SITE_OTHER): Payer: 59 | Admitting: Family

## 2021-04-18 VITALS — BP 138/80 | HR 90 | Temp 98.3°F | Ht 62.0 in | Wt 201.8 lb

## 2021-04-18 DIAGNOSIS — R232 Flushing: Secondary | ICD-10-CM

## 2021-04-18 DIAGNOSIS — F419 Anxiety disorder, unspecified: Secondary | ICD-10-CM | POA: Diagnosis not present

## 2021-04-18 MED ORDER — VENLAFAXINE HCL ER 150 MG PO CP24: 150.0000 mg | ORAL_CAPSULE | Freq: Every day | ORAL | 1 refills | Status: DC
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
## 2021-05-16 ENCOUNTER — Other Ambulatory Visit: Payer: Self-pay

## 2021-05-16 ENCOUNTER — Encounter: Payer: Self-pay | Admitting: Infectious Diseases

## 2021-05-16 ENCOUNTER — Ambulatory Visit (INDEPENDENT_AMBULATORY_CARE_PROVIDER_SITE_OTHER): Payer: 59 | Admitting: Infectious Diseases

## 2021-05-16 VITALS — BP 152/104 | HR 85 | Temp 98.5°F | Ht 63.0 in | Wt 204.0 lb

## 2021-05-16 DIAGNOSIS — B182 Chronic viral hepatitis C: Secondary | ICD-10-CM | POA: Diagnosis not present

## 2021-05-16 NOTE — Assessment & Plan Note (Signed)
Reviwed course of treatment with [place] today. She did very well on Harvoni and took her course of treatment as expected. EOT viral load was undetectable and LFTs normalized. Will check her cure / SVR level today.   No evidence of cirrhosis / advanced cirrhosis on ultrasound and her APRI was favorable as well. I see no reason she needs further screenings going forward.  Discussed recommended vaccines (hepatitis A and prevnar 20) - she would like to defer until after she returns from an upcoming trip and may get them with her primary care team.   Discussed that her hepatitis c antibody blood test will always be positive for life. This is only a memory of old infection and does not mean she has chronic infection. She has no risk factors going forward for hepatitis c and plans to avoid unregulated tattoos (suspected transmission for her).   I will let her know results and recommendations once labs return but I suspect this will be her last visit here.
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
## 2021-05-16 NOTE — Patient Instructions (Addendum)
Please stop by the lab today. Will let you know results when they return (next week).   For the recommended vaccines to have either here or with your regular doctor:  Hepatitis A vaccine - prevents the hepatitis A virus. This is 2 shots spaced out 6 months apart. Hepatitis A is spread through contaminated food.   2.   Prevnar 20 - this is a pneumonia vaccine. It helps to protect from bacterial pneumonia.

## 2021-05-18 LAB — HEPATITIS C RNA QUANTITATIVE
HCV Quantitative Log: <1.18 log IU/mL
HCV RNA, PCR, QN: <15 IU/mL

## 2021-05-22 ENCOUNTER — Encounter: Payer: Self-pay | Admitting: Family

## 2021-05-23 ENCOUNTER — Other Ambulatory Visit: Payer: Self-pay | Admitting: Family

## 2021-05-23 DIAGNOSIS — F32A Depression, unspecified: Secondary | ICD-10-CM

## 2021-05-23 DIAGNOSIS — F419 Anxiety disorder, unspecified: Secondary | ICD-10-CM

## 2021-06-13 ENCOUNTER — Ambulatory Visit (INDEPENDENT_AMBULATORY_CARE_PROVIDER_SITE_OTHER): Payer: 59 | Admitting: Psychologist

## 2021-06-13 DIAGNOSIS — F411 Generalized anxiety disorder: Secondary | ICD-10-CM

## 2021-06-13 NOTE — Progress Notes (Signed)
                Yolinda Duerr, PsyD 
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
## 2021-06-13 NOTE — Plan of Care (Signed)

## 2021-07-07 ENCOUNTER — Ambulatory Visit (INDEPENDENT_AMBULATORY_CARE_PROVIDER_SITE_OTHER): Payer: 59 | Admitting: Psychologist

## 2021-07-07 DIAGNOSIS — F411 Generalized anxiety disorder: Secondary | ICD-10-CM

## 2021-07-07 NOTE — Progress Notes (Signed)
[facility] Counselor/Therapist Progress Note ? ?Patient ID: Angel Tucker, MRN: [id number],   ? ?Date: 07/07/2021 ? ?Time Spent: 1:07 pm to 1:47 pm; total time: 40 minutes ? ? This session was held via video webex teletherapy due to the coronavirus risk at this time. The patient consented to video teletherapy and was located at her home during this session. She is aware it is the responsibility of the patient to secure confidentiality on her end of the session. The provider was in a private home office for the duration of this session. Limits of confidentiality were discussed with the patient.  ? ?Treatment Type: Individual Therapy ? ?Reported Symptoms: Stress related to work ? ?Mental Status Exam: ?Appearance:  Well Groomed     ?Behavior: Appropriate  ?Motor: Normal  ?Speech/Language:  Clear and Coherent  ?Affect: Appropriate  ?Mood: normal  ?Thought process: normal  ?Thought content:   WNL  ?Sensory/Perceptual disturbances:   WNL  ?Orientation: oriented to person, place, and time/date  ?Attention: Good  ?Concentration: Good  ?Memory: WNL  ?Fund of knowledge:  Fair  ?Insight:   Fair  ?Judgment:  Fair  ?Impulse Control: Good  ? ?Risk Assessment: ?Danger to Self:  No ?Self-injurious Behavior: No ?Danger to Others: No ?Duty to Warn:no ?Physical Aggression / Violence:No  ?Access to Firearms a concern: No  ?Gang Involvement:No  ? ?Subjective: Beginning the session, patient described herself as okay. After reviewing the treatment plan, the patient stated that she wanted coping strategies. After discussing coping strategies, the patient described herself as better. She was agreeable to homework and following up. She denied suicidal and homicidal ideation.   ? ?Interventions:  Worked on developing a therapeutic relationship with the patient using active listening and reflective statements. Provided emotional support using empathy and validation. Reviewed the treatment plan with the patient. Reviewed  events since the intake took place. Validated expressed thoughts. Provided psychoeducation about mindfulness, mindful moments, and mindful videos. Practiced and processed mindfulness. Praised patient for experiencing less distress. Assisted in problem solving. Challenged some of the thoughts expressed by the patient. Validated thoughts expressed. Assigned homework. Assessed for suicidal and homicidal ideation.  ? ?Homework: Implement mindfulness ? ?Next Session: Review homework and emotional support.  ? ?Diagnosis: F41.1 generalized anxiety disorder ? ?Plan:  ?Goals ?Reduce overall frequency, intensity, and duration of anxiety ?Stabilize anxiety level wile increasing ability to function ?Enhance ability to effectively cope with full variety of stressors ?Learn and implement coping skills that result in a reduction of anxiety  ? ?Objectives target date for all objectives is 06/14/2022.  ?Verbalize an understanding of the cognitive, physiological, and behavioral components of anxiety ?Learning and implement calming skills to reduce overall anxiety ?Verbalize an understanding of the role that cognitive biases play in excessive irrational worry and persistent anxiety symptoms ?Identify, challenge, and replace based fearful talk ?Learn and implement problem solving strategies ?Identify and engage in pleasant activities ?Learning and implement personal and interpersonal skills to reduce anxiety and improve interpersonal relationships ?Learn to accept limitations in life and commit to tolerating, rather than avoiding, unpleasant emotions while accomplishing meaningful goals ?Identify major life conflicts from the past and present that form the basis for present anxiety ?Maintain involvement in work, family, and social activities ?Reestablish a consistent sleep-wake cycle ?Cooperate with a medical evaluation  ?Interventions ?Engage the patient in behavioral activation ?Use instruction, modeling, and role-playing to build the  client's general social, communication, and/or conflict resolution skills ?Use Acceptance and Commitment Therapy to help client accept  uncomfortable realities in order to accomplish value-consistent goals ?Reinforce the client's insight into the role of his/her past emotional pain and present anxiety  ?Support the client in following through with work, family, and social activities ?Teach and implement sleep hygiene practices  ?Refer the patient to a physician for a psychotropic medication consultation ?Monito the clint's psychotropic medication compliance ?Discuss how anxiety typically involves excessive worry, various bodily expressions of tension, and avoidance of what is threatening that interact to maintain the problem  ?Teach the patient relaxation skills ?Assign the patient homework ?Discuss examples demonstrating that unrealistic worry overestimates the probability of threats and underestimates patient's ability  ?Assist the patient in analyzing his or her worries ?Help patient understand that avoidance is reinforcing  ? ?The patient and clinician reviewed the treatment plan on 07/07/2021. The patient approved of the treatment plan.  ? ? ?Conception Chancy, PsyD ? ? ? ?

## 2021-07-07 NOTE — Progress Notes (Signed)
                Berneta Sconyers, PsyD 

## 2021-07-17 ENCOUNTER — Encounter: Payer: Self-pay | Admitting: Family

## 2021-07-25 ENCOUNTER — Telehealth (INDEPENDENT_AMBULATORY_CARE_PROVIDER_SITE_OTHER): Payer: 59 | Admitting: Family

## 2021-07-25 ENCOUNTER — Encounter: Payer: Self-pay | Admitting: Family

## 2021-07-25 VITALS — Ht 63.0 in

## 2021-07-25 DIAGNOSIS — R7309 Other abnormal glucose: Secondary | ICD-10-CM | POA: Diagnosis not present

## 2021-07-25 DIAGNOSIS — R5383 Other fatigue: Secondary | ICD-10-CM

## 2021-07-25 DIAGNOSIS — R0789 Other chest pain: Secondary | ICD-10-CM | POA: Diagnosis not present

## 2021-07-25 DIAGNOSIS — R109 Unspecified abdominal pain: Secondary | ICD-10-CM | POA: Diagnosis not present
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
## 2021-07-25 NOTE — Patient Instructions (Signed)
We will set up cardiology and GI referrals for you. If you have another one of these episodes in the interim, please go directly to [facility] or ER so you can be seen in person.  ?

## 2021-07-31 ENCOUNTER — Other Ambulatory Visit (INDEPENDENT_AMBULATORY_CARE_PROVIDER_SITE_OTHER): Payer: 59

## 2021-07-31 DIAGNOSIS — R7309 Other abnormal glucose: Secondary | ICD-10-CM

## 2021-07-31 DIAGNOSIS — R5383 Other fatigue: Secondary | ICD-10-CM | POA: Diagnosis not present

## 2021-07-31 LAB — CBC WITH DIFFERENTIAL/PLATELET
Basophils Absolute: 0 10*3/uL (ref 0.0–0.1)
Basophils Relative: 0.3 % (ref 0.0–3.0)
Eosinophils Absolute: 0.1 10*3/uL (ref 0.0–0.7)
Eosinophils Relative: 1.1 % (ref 0.0–5.0)
HCT: 40.6 % (ref 36.0–46.0)
Hemoglobin: 13.1 g/dL (ref 12.0–15.0)
Lymphocytes Relative: 41.3 % (ref 12.0–46.0)
Lymphs Abs: 3.5 10*3/uL (ref 0.7–4.0)
MCHC: 32.3 g/dL (ref 30.0–36.0)
MCV: 85.1 fl (ref 78.0–100.0)
Monocytes Absolute: 0.6 10*3/uL (ref 0.1–1.0)
Monocytes Relative: 6.7 % (ref 3.0–12.0)
Neutro Abs: 4.3 10*3/uL (ref 1.4–7.7)
Neutrophils Relative %: 50.6 % (ref 43.0–77.0)
Platelets: 207 10*3/uL (ref 150.0–400.0)
RBC: 4.77 Mil/uL (ref 3.87–5.11)
RDW: 13.7 % (ref 11.5–15.5)
WBC: 8.5 10*3/uL (ref 4.0–10.5)

## 2021-07-31 LAB — COMPREHENSIVE METABOLIC PANEL
ALT: 11 U/L (ref 0–35)
AST: 12 U/L (ref 0–37)
Albumin: 4.4 g/dL (ref 3.5–5.2)
Alkaline Phosphatase: 120 U/L — ABNORMAL HIGH (ref 39–117)
BUN: 11 mg/dL (ref 6–23)
CO2: 28 mEq/L (ref 19–32)
Calcium: 9.6 mg/dL (ref 8.4–10.5)
Chloride: 101 mEq/L (ref 96–112)
Creatinine, Ser: 0.93 mg/dL (ref 0.40–1.20)
GFR: 71.85 mL/min (ref >60.00)
Glucose, Bld: 134 mg/dL — ABNORMAL HIGH (ref 70–99)
Potassium: 3.8 mEq/L (ref 3.5–5.1)
Sodium: 138 mEq/L (ref 135–145)
Total Bilirubin: 0.3 mg/dL (ref 0.2–1.2)
Total Protein: 7.8 g/dL (ref 6.0–8.3)

## 2021-07-31 LAB — HEMOGLOBIN A1C: Hgb A1c MFr Bld: 7.4 % — ABNORMAL HIGH (ref 4.6–6.5)

## 2021-07-31 LAB — TSH: TSH: 0.73 u[IU]/mL (ref 0.35–5.50)

## 2021-08-01 ENCOUNTER — Other Ambulatory Visit: Payer: Self-pay | Admitting: Family

## 2021-08-01 MED ORDER — METFORMIN HCL 500 MG PO TABS: 500.0000 mg | ORAL_TABLET | Freq: Two times a day (BID) | ORAL | 1 refills | Status: DC

## 2021-08-11 ENCOUNTER — Ambulatory Visit: Payer: 59 | Admitting: Psychologist

## 2021-08-12 ENCOUNTER — Ambulatory Visit (INDEPENDENT_AMBULATORY_CARE_PROVIDER_SITE_OTHER): Payer: 59 | Admitting: Psychologist

## 2021-08-12 DIAGNOSIS — F411 Generalized anxiety disorder: Secondary | ICD-10-CM | POA: Diagnosis not present

## 2021-08-12 NOTE — Progress Notes (Signed)
[facility] Counselor/Therapist Progress Note ? ?Patient ID: Angel Tucker, MRN: [id number],   ? ?Date: 08/12/2021 ? ?Time Spent: 3:02 pm to 3:35 pm; total time: 33 minutes ? ? This session was held via video webex teletherapy due to the coronavirus risk at this time. The patient consented to video teletherapy and was located at her home during this session. She is aware it is the responsibility of the patient to secure confidentiality on her end of the session. The provider was in a private home office for the duration of this session. Limits of confidentiality were discussed with the patient.  ? ?Treatment Type: Individual Therapy ? ?Reported Symptoms: Stress related to work ? ?Mental Status Exam: ?Appearance:  Well Groomed     ?Behavior: Appropriate  ?Motor: Normal  ?Speech/Language:  Clear and Coherent  ?Affect: Appropriate  ?Mood: normal  ?Thought process: normal  ?Thought content:   WNL  ?Sensory/Perceptual disturbances:   WNL  ?Orientation: oriented to person, place, and time/date  ?Attention: Good  ?Concentration: Good  ?Memory: WNL  ?Fund of knowledge:  Fair  ?Insight:   Fair  ?Judgment:  Fair  ?Impulse Control: Good  ? ?Risk Assessment: ?Danger to Self:  No ?Self-injurious Behavior: No ?Danger to Others: No ?Duty to Warn:no ?Physical Aggression / Violence:No  ?Access to Firearms a concern: No  ?Gang Involvement:No  ? ?Subjective: Beginning the session, patient described herself as okay while reflecting on events since the last session. Per the patient, she is hesitant to return to work due to COVID-19. She explored different reasons as to why returning to work is not in her best interest. She identified a strategy to see if her company would allow her to work from home. She ended the session voicing that she had lost people recently, but did not want to process those losses.  She denied suicidal and homicidal ideation.   ? ?Interventions:  Worked on developing a therapeutic relationship  with the patient using active listening and reflective statements. Provided emotional support using empathy and validation. Used summary statements. Explored fears that patient experiences related to COVID-19. Explored evidence to support patient's beliefs. Challenged some of the thoughts expressed by the patient. Provided psychoeducation to the patient. Used MI to roll with the resistance. Explored ways to see if company can accommodate concerns. Assisted in problem solving. Explored whether or not the patient wanted to process the losses recently experienced. Discussed next steps for counseling.  Assessed for suicidal and homicidal ideation.  ? ?Homework: Discuss with HR concerns related to COVID-19.  ? ?Next Session: Review homework and emotional support.  ? ?Diagnosis: F41.1 generalized anxiety disorder ? ?Plan:  ?Goals ?Reduce overall frequency, intensity, and duration of anxiety ?Stabilize anxiety level wile increasing ability to function ?Enhance ability to effectively cope with full variety of stressors ?Learn and implement coping skills that result in a reduction of anxiety  ? ?Objectives target date for all objectives is 06/14/2022.  ?Verbalize an understanding of the cognitive, physiological, and behavioral components of anxiety ?Learning and implement calming skills to reduce overall anxiety ?Verbalize an understanding of the role that cognitive biases play in excessive irrational worry and persistent anxiety symptoms ?Identify, challenge, and replace based fearful talk ?Learn and implement problem solving strategies ?Identify and engage in pleasant activities ?Learning and implement personal and interpersonal skills to reduce anxiety and improve interpersonal relationships ?Learn to accept limitations in life and commit to tolerating, rather than avoiding, unpleasant emotions while accomplishing meaningful goals ?Identify major life conflicts  from the past and present that form the basis for present  anxiety ?Maintain involvement in work, family, and social activities ?Reestablish a consistent sleep-wake cycle ?Cooperate with a medical evaluation  ?Interventions ?Engage the patient in behavioral activation ?Use instruction, modeling, and role-playing to build the client's general social, communication, and/or conflict resolution skills ?Use Acceptance and Commitment Therapy to help client accept uncomfortable realities in order to accomplish value-consistent goals ?Reinforce the client's insight into the role of his/her past emotional pain and present anxiety  ?Support the client in following through with work, family, and social activities ?Teach and implement sleep hygiene practices  ?Refer the patient to a physician for a psychotropic medication consultation ?Monito the clint's psychotropic medication compliance ?Discuss how anxiety typically involves excessive worry, various bodily expressions of tension, and avoidance of what is threatening that interact to maintain the problem  ?Teach the patient relaxation skills ?Assign the patient homework ?Discuss examples demonstrating that unrealistic worry overestimates the probability of threats and underestimates patient's ability  ?Assist the patient in analyzing his or her worries ?Help patient understand that avoidance is reinforcing  ? ?The patient and clinician reviewed the treatment plan on 07/07/2021. The patient approved of the treatment plan.  ? ? ?Conception Chancy, PsyD ? ?

## 2021-08-14 ENCOUNTER — Encounter: Payer: Self-pay | Admitting: Family

## 2021-08-14 ENCOUNTER — Ambulatory Visit: Payer: 59 | Admitting: Psychologist

## 2021-08-22 ENCOUNTER — Other Ambulatory Visit: Payer: Self-pay

## 2021-08-22 DIAGNOSIS — G43909 Migraine, unspecified, not intractable, without status migrainosus: Secondary | ICD-10-CM | POA: Insufficient documentation

## 2021-08-22 DIAGNOSIS — E119 Type 2 diabetes mellitus without complications: Secondary | ICD-10-CM | POA: Insufficient documentation

## 2021-08-22 DIAGNOSIS — G4733 Obstructive sleep apnea (adult) (pediatric): Secondary | ICD-10-CM | POA: Insufficient documentation

## 2021-08-22 DIAGNOSIS — F419 Anxiety disorder, unspecified: Secondary | ICD-10-CM | POA: Insufficient documentation

## 2021-08-22 DIAGNOSIS — F32A Depression, unspecified: Secondary | ICD-10-CM | POA: Insufficient documentation

## 2021-08-22 DIAGNOSIS — D219 Benign neoplasm of connective and other soft tissue, unspecified: Secondary | ICD-10-CM | POA: Insufficient documentation

## 2021-08-22 DIAGNOSIS — M549 Dorsalgia, unspecified: Secondary | ICD-10-CM | POA: Insufficient documentation

## 2021-08-22 DIAGNOSIS — Z973 Presence of spectacles and contact lenses: Secondary | ICD-10-CM | POA: Insufficient documentation

## 2021-08-22 DIAGNOSIS — I10 Essential (primary) hypertension: Secondary | ICD-10-CM | POA: Insufficient documentation
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
## 2021-08-28 ENCOUNTER — Ambulatory Visit (INDEPENDENT_AMBULATORY_CARE_PROVIDER_SITE_OTHER): Payer: 59 | Admitting: Cardiology

## 2021-08-28 ENCOUNTER — Encounter: Payer: Self-pay | Admitting: Cardiology

## 2021-08-28 VITALS — BP 128/80 | HR 85 | Ht 63.6 in | Wt 204.1 lb

## 2021-08-28 DIAGNOSIS — R072 Precordial pain: Secondary | ICD-10-CM

## 2021-08-28 DIAGNOSIS — I10 Essential (primary) hypertension: Secondary | ICD-10-CM | POA: Diagnosis not present

## 2021-08-28 DIAGNOSIS — E119 Type 2 diabetes mellitus without complications: Secondary | ICD-10-CM

## 2021-08-28 DIAGNOSIS — R531 Weakness: Secondary | ICD-10-CM

## 2021-08-28 MED ORDER — METOPROLOL TARTRATE 100 MG PO TABS: 100.0000 mg | ORAL_TABLET | Freq: Once | ORAL | 0 refills | Status: DC

## 2021-08-28 NOTE — Patient Instructions (Signed)
Medication Instructions:  Your physician recommends that you continue on your current medications as directed. Please refer to the Current Medication list given to you today.  *If you need a refill on your cardiac medications before your next appointment, please call your pharmacy*   Lab Work:  Labs 1 week before CT: BMP  If you have labs (blood work) drawn today and your tests are completely normal, you will receive your results only by: [place] (if you have MyChart) OR A paper copy in the mail If you have any lab test that is abnormal or we need to change your treatment, we will call you to review the results.   Testing/Procedures:   Your cardiac CT will be scheduled at one of the below locations:   [facility] [address] [place], [place] [address] [phone]  [facility] [address] [place], [place] [address] [phone]  If scheduled at [facility], please arrive at the [facility] Entrance (Entrance C2) of [facility] 30 minutes prior to test start time. You can use the FREE valet parking offered at entrance C (encouraged to control the heart rate for the test)  Proceed to the [facility] Radiology Department (first floor) to check-in and test prep.  All radiology patients and guests should use entrance C2 at [facility], accessed from [facility], even though the hospital's physical address listed is [address]    If scheduled at [facility], please arrive 15 mins early for check-in and test prep.  Please follow these instructions carefully (unless otherwise directed):  On the Night Before the Test: Be sure to Drink plenty of water. Do not consume any caffeinated/decaffeinated beverages or chocolate 12 hours prior to your test. Do not take any antihistamines 12 hours prior to your test.  On the Day  of the Test: Drink plenty of water until 1 hour prior to the test. Do not eat any food 4 hours prior to the test. You may take your regular medications prior to the test.  Take metoprolol (Lopressor) two hours prior to test. HOLD: Diovan-HCT FEMALES- please wear underwire-free bra if available, avoid dresses & tight clothing  After the Test: Drink plenty of water. After receiving IV contrast, you may experience a mild flushed feeling. This is normal. On occasion, you may experience a mild rash up to 24 hours after the test. This is not dangerous. If this occurs, you can take Benadryl 25 mg and increase your fluid intake. If you experience trouble breathing, this can be serious. If it is severe call 911 IMMEDIATELY. If it is mild, please call our office. If you take any of these medications: Glipizide/Metformin, Avandament, Glucavance, please do not take 48 hours after completing test unless otherwise instructed.  We will call to schedule your test 2-4 weeks out understanding that some insurance companies will need an authorization prior to the service being performed.   For non-scheduling related questions, please contact the cardiac imaging nurse navigator should you have any questions/concerns: Marchia Bond, Cardiac Imaging Nurse Navigator Gordy Clement, Cardiac Imaging Nurse Navigator [facility] Direct Office Dial: [phone]   For scheduling needs, including cancellations and rescheduling, please call [place], [phone].    Follow-Up: At [facility], you and your health needs are our priority.  As part of our continuing mission to provide you with exceptional heart care, we have created  designated Provider Care Teams.  These Care Teams include your primary Cardiologist (physician) and Advanced Practice Providers (APPs -  Physician Assistants and Nurse Practitioners) who all work together to provide you with the care you need, when you need  it.  We recommend signing up for the patient portal called "MyChart".  Sign up information is provided on this After Visit Summary.  MyChart is used to connect with patients for Virtual Visits (Telemedicine).  Patients are able to view lab/test results, encounter notes, upcoming appointments, etc.  Non-urgent messages can be sent to your provider as well.   To learn more about what you can do with MyChart, go to [email or web address].    Your next appointment:   3 month(s)  The format for your next appointment:   In Person  Provider:   Shirlee More, MD    Other Instructions Glucose gel OTC for hypoglycemia  Important Information About Sugar

## 2021-09-01 ENCOUNTER — Encounter: Payer: Self-pay | Admitting: Family

## 2021-09-03 ENCOUNTER — Encounter: Payer: Self-pay | Admitting: Gastroenterology

## 2021-09-15 ENCOUNTER — Ambulatory Visit (INDEPENDENT_AMBULATORY_CARE_PROVIDER_SITE_OTHER): Payer: 59 | Admitting: Psychologist

## 2021-09-15 DIAGNOSIS — F411 Generalized anxiety disorder: Secondary | ICD-10-CM

## 2021-09-15 NOTE — Progress Notes (Signed)
[facility] Counselor/Therapist Progress Note  Patient ID: Angel Tucker, MRN: [id number],    Date: 09/15/2021  Time Spent: 01:01 pm to 1:17 pm; total time: 16 minutes   This session was held via video webex teletherapy due to the coronavirus risk at this time. The patient consented to video teletherapy and was located at her home during this session. She is aware it is the responsibility of the patient to secure confidentiality on her end of the session. The provider was in a private home office for the duration of this session. Limits of confidentiality were discussed with the patient.   Treatment Type: Individual Therapy  Reported Symptoms: Stress related to getting paperwork for work  Mental Status Exam: Appearance:  Well Groomed     Behavior: Appropriate  Motor: Normal  Speech/Language:  Clear and Coherent  Affect: Appropriate  Mood: normal  Thought process: normal  Thought content:   WNL  Sensory/Perceptual disturbances:   WNL  Orientation: oriented to person, place, and time/date  Attention: Good  Concentration: Good  Memory: WNL  Fund of knowledge:  Fair  Insight:   Fair  Judgment:  Fair  Impulse Control: Good   Risk Assessment: Danger to Self:  No Self-injurious Behavior: No Danger to Others: No Duty to Warn:no Physical Aggression / Violence:No  Access to Firearms a concern: No  Gang Involvement:No   Subjective: Beginning the session, patient described herself as okay indicating that nothing has changed since the last appointment. She stated that she is working with her medical providers to get exemptions to work from home. Per the patient, she stated that she has not attempted to implement the coping strategies previously discussed. She also stated that she has no idea how she became infected with hepatitis C and how she got diabetes. Patient briefly processed thoughts before saying that coping strategies won't help her.  She denied suicidal and  homicidal ideation.    Interventions:  Worked on developing a therapeutic relationship with the patient using active listening and reflective statements. Provided emotional support using empathy and validation. Used summary statements. Reviewed events since the last session. Provided psychoeducation about paperwork and not being able to fill it out. Explored if patient had implemented previously discussed coping strategies. Used MI to roll with the resistance. Validated expressed concerns. Used reflective listening to assist the patient in being heard. Identified goals for the session. Validated challenges that patient experiences in managing diabetes. Explored if patient wanted to consider other coping strategies. Validated expressed thoughts and emotions. Offered additional tools. Assessed for suicidal and homicidal ideation.    Homework: NA  Next Session: NA  Diagnosis: F41.1 generalized anxiety disorder  Plan:  Goals Reduce overall frequency, intensity, and duration of anxiety Stabilize anxiety level wile increasing ability to function Enhance ability to effectively cope with full variety of stressors Learn and implement coping skills that result in a reduction of anxiety   Objectives target date for all objectives is 06/14/2022.  Verbalize an understanding of the cognitive, physiological, and behavioral components of anxiety Learning and implement calming skills to reduce overall anxiety Verbalize an understanding of the role that cognitive biases play in excessive irrational worry and persistent anxiety symptoms Identify, challenge, and replace based fearful talk Learn and implement problem solving strategies Identify and engage in pleasant activities Learning and implement personal and interpersonal skills to reduce anxiety and improve interpersonal relationships Learn to accept limitations in life and commit to tolerating, rather than avoiding, unpleasant emotions while accomplishing  meaningful goals Identify major life conflicts from the past and present that form the basis for present anxiety Maintain involvement in work, family, and social activities Reestablish a consistent sleep-wake cycle Cooperate with a medical evaluation  Interventions Engage the patient in behavioral activation Use instruction, modeling, and role-playing to build the client's general social, communication, and/or conflict resolution skills Use Acceptance and Commitment Therapy to help client accept uncomfortable realities in order to accomplish value-consistent goals Reinforce the client's insight into the role of his/her past emotional pain and present anxiety  Support the client in following through with work, family, and social activities Teach and implement sleep hygiene practices  Refer the patient to a physician for a psychotropic medication consultation Monito the clint's psychotropic medication compliance Discuss how anxiety typically involves excessive worry, various bodily expressions of tension, and avoidance of what is threatening that interact to maintain the problem  Teach the patient relaxation skills Assign the patient homework Discuss examples demonstrating that unrealistic worry overestimates the probability of threats and underestimates patient's ability  Assist the patient in analyzing his or her worries Help patient understand that avoidance is reinforcing   The patient and clinician reviewed the treatment plan on 07/07/2021. The patient approved of the treatment plan.    Conception Chancy, PsyD

## 2021-09-17 ENCOUNTER — Encounter: Payer: Self-pay | Admitting: Cardiology

## 2021-09-17 ENCOUNTER — Telehealth (HOSPITAL_COMMUNITY): Payer: Self-pay | Admitting: Emergency Medicine

## 2021-09-17 NOTE — Telephone Encounter (Signed)
Reaching out to patient to offer assistance regarding upcoming cardiac imaging study; pt verbalizes understanding of appt date/time, parking situation and where to check in, pre-test NPO status and medications ordered, and verified current allergies; name and call back number provided for further questions should they arise Marchia Bond RN Navigator Cardiac Imaging [facility] [phone] office [phone] cell  '100mg'$  metoprolol  Hold diovan Denies iv issues Arrival 900

## 2021-09-18 ENCOUNTER — Ambulatory Visit (HOSPITAL_COMMUNITY)
Admission: RE | Admit: 2021-09-18 | Discharge: 2021-09-18 | Disposition: A | Discharge status: Home / Self Care | Payer: 59 | Source: Ambulatory Visit | Attending: Cardiology | Admitting: Cardiology

## 2021-09-18 DIAGNOSIS — R072 Precordial pain: Secondary | ICD-10-CM

## 2021-09-18 MED ORDER — NITROGLYCERIN 0.4 MG SL SUBL
SUBLINGUAL_TABLET | SUBLINGUAL | Status: AC
  Filled 2021-09-18: qty 2

## 2021-09-18 MED ORDER — NITROGLYCERIN 0.4 MG SL SUBL
0.8000 mg | SUBLINGUAL_TABLET | Freq: Once | SUBLINGUAL | Status: AC
  Administered 2021-09-18: 0.8 mg via SUBLINGUAL

## 2021-09-18 MED ORDER — IOHEXOL 350 MG/ML SOLN
100.0000 mL | Freq: Once | INTRAVENOUS | Status: AC | PRN
  Administered 2021-09-18: 100 mL via INTRAVENOUS

## 2021-09-19 ENCOUNTER — Encounter: Payer: Self-pay | Admitting: Family

## 2021-09-23 ENCOUNTER — Encounter: Payer: Self-pay | Admitting: Family

## 2021-09-23 ENCOUNTER — Telehealth (INDEPENDENT_AMBULATORY_CARE_PROVIDER_SITE_OTHER): Payer: 59 | Admitting: Family

## 2021-09-23 DIAGNOSIS — F418 Other specified anxiety disorders: Secondary | ICD-10-CM | POA: Diagnosis not present

## 2021-09-23 DIAGNOSIS — I10 Essential (primary) hypertension: Secondary | ICD-10-CM

## 2021-09-23 DIAGNOSIS — E119 Type 2 diabetes mellitus without complications: Secondary | ICD-10-CM | POA: Diagnosis not present

## 2021-09-25 ENCOUNTER — Encounter: Payer: Self-pay | Admitting: Family

## 2021-09-27 ENCOUNTER — Other Ambulatory Visit: Payer: Self-pay | Admitting: Family

## 2021-10-01 ENCOUNTER — Ambulatory Visit: Payer: 59 | Admitting: Gastroenterology

## 2021-10-29 ENCOUNTER — Encounter: Payer: Self-pay | Admitting: Family

## 2021-10-31 ENCOUNTER — Encounter: Payer: Self-pay | Admitting: Family

## 2021-11-06 ENCOUNTER — Other Ambulatory Visit: Payer: Self-pay | Admitting: Family

## 2021-11-06 DIAGNOSIS — R112 Nausea with vomiting, unspecified: Secondary | ICD-10-CM

## 2021-11-11 ENCOUNTER — Encounter: Payer: Self-pay | Admitting: Family

## 2021-11-11 ENCOUNTER — Ambulatory Visit (INDEPENDENT_AMBULATORY_CARE_PROVIDER_SITE_OTHER): Payer: 59 | Admitting: Family

## 2021-11-11 VITALS — BP 140/90 | HR 73 | Temp 98.5°F | Ht 63.0 in | Wt 201.0 lb

## 2021-11-11 DIAGNOSIS — F419 Anxiety disorder, unspecified: Secondary | ICD-10-CM

## 2021-11-11 DIAGNOSIS — I10 Essential (primary) hypertension: Secondary | ICD-10-CM

## 2021-11-11 DIAGNOSIS — E119 Type 2 diabetes mellitus without complications: Secondary | ICD-10-CM

## 2021-11-11 DIAGNOSIS — R112 Nausea with vomiting, unspecified: Secondary | ICD-10-CM

## 2021-11-11 LAB — CBC WITH DIFFERENTIAL/PLATELET
Basophils Absolute: 0.1 10*3/uL (ref 0.0–0.1)
Basophils Relative: 1 % (ref 0.0–3.0)
Eosinophils Absolute: 0.1 10*3/uL (ref 0.0–0.7)
Eosinophils Relative: 1.1 % (ref 0.0–5.0)
HCT: 41 % (ref 36.0–46.0)
Hemoglobin: 13.2 g/dL (ref 12.0–15.0)
Lymphocytes Relative: 41.8 % (ref 12.0–46.0)
Lymphs Abs: 2.6 10*3/uL (ref 0.7–4.0)
MCHC: 32.2 g/dL (ref 30.0–36.0)
MCV: 86.1 fl (ref 78.0–100.0)
Monocytes Absolute: 0.4 10*3/uL (ref 0.1–1.0)
Monocytes Relative: 5.8 % (ref 3.0–12.0)
Neutro Abs: 3.1 10*3/uL (ref 1.4–7.7)
Neutrophils Relative %: 50.3 % (ref 43.0–77.0)
Platelets: 223 10*3/uL (ref 150.0–400.0)
RBC: 4.76 Mil/uL (ref 3.87–5.11)
RDW: 14.5 % (ref 11.5–15.5)
WBC: 6.2 10*3/uL (ref 4.0–10.5)

## 2021-11-11 LAB — COMPREHENSIVE METABOLIC PANEL
ALT: 11 U/L (ref 0–35)
AST: 15 U/L (ref 0–37)
Albumin: 4.5 g/dL (ref 3.5–5.2)
Alkaline Phosphatase: 106 U/L (ref 39–117)
BUN: 9 mg/dL (ref 6–23)
CO2: 30 mEq/L (ref 19–32)
Calcium: 9.8 mg/dL (ref 8.4–10.5)
Chloride: 101 mEq/L (ref 96–112)
Creatinine, Ser: 0.82 mg/dL (ref 0.40–1.20)
GFR: 83.4 mL/min (ref >60.00)
Glucose, Bld: 77 mg/dL (ref 70–99)
Potassium: 3.8 mEq/L (ref 3.5–5.1)
Sodium: 139 mEq/L (ref 135–145)
Total Bilirubin: 0.4 mg/dL (ref 0.2–1.2)
Total Protein: 7.6 g/dL (ref 6.0–8.3)

## 2021-11-11 LAB — HEMOGLOBIN A1C: Hgb A1c MFr Bld: 7.2 % — ABNORMAL HIGH (ref 4.6–6.5)

## 2021-11-11 MED ORDER — VALSARTAN-HYDROCHLOROTHIAZIDE 320-25 MG PO TABS: 1.0000 | ORAL_TABLET | Freq: Every day | ORAL | 1 refills | Status: DC
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
## 2021-11-13 ENCOUNTER — Encounter: Payer: Self-pay | Admitting: Family

## 2021-11-14 ENCOUNTER — Encounter: Payer: Self-pay | Admitting: Family

## 2021-11-14 ENCOUNTER — Other Ambulatory Visit: Payer: Self-pay | Admitting: Family

## 2021-11-14 NOTE — Telephone Encounter (Signed)
Marissa from [place] [employer] [place] stated she was returning a call from our office. Her # is [phone].

## 2021-11-14 NOTE — Telephone Encounter (Signed)
The letter has been faxed to [facility] at [phone] with confirmation and pt aware.
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
## 2021-12-09 ENCOUNTER — Ambulatory Visit: Payer: 59 | Attending: Cardiology | Admitting: Cardiology

## 2021-12-19 ENCOUNTER — Encounter: Payer: Self-pay | Admitting: Internal Medicine

## 2022-01-29 ENCOUNTER — Ambulatory Visit: Payer: 59 | Admitting: Internal Medicine

## 2022-03-11 ENCOUNTER — Encounter: Payer: Self-pay | Admitting: Internal Medicine

## 2022-03-11 ENCOUNTER — Ambulatory Visit (INDEPENDENT_AMBULATORY_CARE_PROVIDER_SITE_OTHER): Payer: 59 | Admitting: Internal Medicine

## 2022-03-11 VITALS — BP 126/80 | HR 81 | Ht 63.0 in | Wt 193.2 lb

## 2022-03-11 DIAGNOSIS — K219 Gastro-esophageal reflux disease without esophagitis: Secondary | ICD-10-CM

## 2022-03-11 DIAGNOSIS — R112 Nausea with vomiting, unspecified: Secondary | ICD-10-CM | POA: Diagnosis not present

## 2022-03-11 DIAGNOSIS — R634 Abnormal weight loss: Secondary | ICD-10-CM

## 2022-03-11 DIAGNOSIS — Z1211 Encounter for screening for malignant neoplasm of colon: Secondary | ICD-10-CM

## 2022-03-11 MED ORDER — NA SULFATE-K SULFATE-MG SULF 17.5-3.13-1.6 GM/177ML PO SOLN: 1.0000 | Freq: Once | ORAL | 0 refills | Status: AC
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
## 2022-03-11 NOTE — Patient Instructions (Signed)
_______________________________________________________  If you are age 50 or older, your body mass index should be between 23-30. Your Body mass index is 34.22 kg/m. If this is out of the aforementioned range listed, please consider follow up with your Primary Care Provider.  If you are age 6 or younger, your body mass index should be between 19-25. Your Body mass index is 34.22 kg/m. If this is out of the aformentioned range listed, please consider follow up with your Primary Care Provider.   ________________________________________________________  The [facility] providers would like to encourage you to use [facility] to communicate with providers for non-urgent requests or questions.  Due to long hold times on the telephone, sending your provider a message by [facility] may be a faster and more efficient way to get a response.  Please allow 48 business hours for a response.  Please remember that this is for non-urgent requests.  _______________________________________________________  Angel Tucker have been scheduled for a colonoscopy. Please follow written instructions given to you at your visit today.  Please pick up your prep supplies at the pharmacy within the next 1-3 days. If you use inhalers (even only as needed), please bring them with you on the day of your procedure.

## 2022-04-20 ENCOUNTER — Encounter: Payer: Self-pay | Admitting: Internal Medicine

## 2022-04-20 ENCOUNTER — Ambulatory Visit (AMBULATORY_SURGERY_CENTER): Payer: 59 | Admitting: Internal Medicine

## 2022-04-20 VITALS — BP 167/92 | HR 74 | Temp 96.8°F | Resp 19 | Ht 63.0 in | Wt 193.0 lb

## 2022-04-20 DIAGNOSIS — R112 Nausea with vomiting, unspecified: Secondary | ICD-10-CM | POA: Diagnosis not present

## 2022-04-20 DIAGNOSIS — D128 Benign neoplasm of rectum: Secondary | ICD-10-CM | POA: Diagnosis not present

## 2022-04-20 DIAGNOSIS — Z1211 Encounter for screening for malignant neoplasm of colon: Secondary | ICD-10-CM

## 2022-04-20 DIAGNOSIS — R634 Abnormal weight loss: Secondary | ICD-10-CM | POA: Diagnosis not present

## 2022-04-20 DIAGNOSIS — D122 Benign neoplasm of ascending colon: Secondary | ICD-10-CM

## 2022-04-20 DIAGNOSIS — K219 Gastro-esophageal reflux disease without esophagitis: Secondary | ICD-10-CM

## 2022-04-20 DIAGNOSIS — K621 Rectal polyp: Secondary | ICD-10-CM | POA: Diagnosis not present

## 2022-04-20 MED ORDER — SODIUM CHLORIDE 0.9 % IV SOLN: 500.0000 mL | Freq: Once | INTRAVENOUS | Status: DC

## 2022-04-20 NOTE — Op Note (Signed)
[facility] Patient Name: Angel Tucker Procedure Date: 04/20/2022 3:50 PM MRN: [id number] Endoscopist: Docia Chuck. Henrene Pastor , MD, [id number] Age: 52 Referring MD:  Date of Birth: 17-Sep-1970 Gender: Female Account #: [id number] Procedure:                Colonoscopy with cold snare polypectomy x 2 Indications:              Screening for colorectal malignant neoplasm Medicines:                Monitored Anesthesia Care Procedure:                Pre-Anesthesia Assessment:                           - Prior to the procedure, a History and Physical                            was performed, and patient medications and                            allergies were reviewed. The patient's tolerance of                            previous anesthesia was also reviewed. The risks                            and benefits of the procedure and the sedation                            options and risks were discussed with the patient.                            All questions were answered, and informed consent                            was obtained. Prior Anticoagulants: The patient has                            taken no anticoagulant or antiplatelet agents. ASA                            Grade Assessment: II - A patient with mild systemic                            disease. After reviewing the risks and benefits,                            the patient was deemed in satisfactory condition to                            undergo the procedure.                           After obtaining informed consent, the colonoscope  was passed under direct vision. Throughout the                            procedure, the patient's blood pressure, pulse, and                            oxygen saturations were monitored continuously. The                            CF [id number] #[id number] was introduced through the anus                            and advanced to the the cecum, identified by                             appendiceal orifice and ileocecal valve. The                            ileocecal valve, appendiceal orifice, and rectum                            were photographed. The quality of the bowel                            preparation was excellent. The colonoscopy was                            performed without difficulty. The patient tolerated                            the procedure well. The bowel preparation used was                            SUPREP via split dose instruction. Scope In: 3:57:41 PM Scope Out: 4:09:58 PM Scope Withdrawal Time: 0 hours 9 minutes 54 seconds  Total Procedure Duration: 0 hours 12 minutes 17 seconds  Findings:                 Two polyps were found in the rectum and ascending                            colon. The polyps were 3 to 7 mm in size. These                            polyps were removed with a cold snare. Resection                            and retrieval were complete.                           Internal hemorrhoids were found during retroflexion.                           The exam was otherwise without abnormality  on                            direct and retroflexion views. Complications:            No immediate complications. Estimated blood loss:                            None. Estimated Blood Loss:     Estimated blood loss: none. Impression:               - Two 3 to 7 mm polyps in the rectum and in the                            ascending colon, removed with a cold snare.                            Resected and retrieved.                           - Internal hemorrhoids.                           - The examination was otherwise normal on direct                            and retroflexion views. Recommendation:           - Repeat colonoscopy in 7 years for surveillance.                           - Patient has a contact number available for                            emergencies. The signs and symptoms of potential                             delayed complications were discussed with the                            patient. Return to normal activities tomorrow.                            Written discharge instructions were provided to the                            patient.                           - Resume previous diet.                           - Continue present medications.                           - Await pathology results. Docia Chuck. Henrene Pastor, MD 04/20/2022 4:14:16 PM This report has been signed electronically.

## 2022-04-20 NOTE — Patient Instructions (Signed)
Handout on polyps and hemorrhoids given to patient. Await pathology results. Resume previous diet and continue present medications. Repeat colonoscopy in 7 years for surveillance!   YOU HAD AN ENDOSCOPIC PROCEDURE TODAY AT [facility]:   Refer to the procedure report that was given to you for any specific questions about what was found during the examination.  If the procedure report does not answer your questions, please call your gastroenterologist to clarify.  If you requested that your care partner not be given the details of your procedure findings, then the procedure report has been included in a sealed envelope for you to review at your convenience later.  YOU SHOULD EXPECT: Some feelings of bloating in the abdomen. Passage of more gas than usual.  Walking can help get rid of the air that was put into your GI tract during the procedure and reduce the bloating. If you had a lower endoscopy (such as a colonoscopy or flexible sigmoidoscopy) you may notice spotting of blood in your stool or on the toilet paper. If you underwent a bowel prep for your procedure, you may not have a normal bowel movement for a few days.  Please Note:  You might notice some irritation and congestion in your nose or some drainage.  This is from the oxygen used during your procedure.  There is no need for concern and it should clear up in a day or so.  SYMPTOMS TO REPORT IMMEDIATELY:  Following lower endoscopy (colonoscopy or flexible sigmoidoscopy):  Excessive amounts of blood in the stool  Significant tenderness or worsening of abdominal pains  Swelling of the abdomen that is new, acute  Fever of 100F or higher  Following upper endoscopy (EGD)  Vomiting of blood or coffee ground material  New chest pain or pain under the shoulder blades  Painful or persistently difficult swallowing  New shortness of breath  Fever of 100F or higher  Black, tarry-looking stools  For urgent or emergent  issues, a gastroenterologist can be reached at any hour by calling [phone]. Do not use MyChart messaging for urgent concerns.    DIET:  We do recommend a small meal at first, but then you may proceed to your regular diet.  Drink plenty of fluids but you should avoid alcoholic beverages for 24 hours.  ACTIVITY:  You should plan to take it easy for the rest of today and you should NOT DRIVE or use heavy machinery until tomorrow (because of the sedation medicines used during the test).    FOLLOW UP: Our staff will call the number listed on your records the next business day following your procedure.  We will call around 7:15- 8:00 am to check on you and address any questions or concerns that you may have regarding the information given to you following your procedure. If we do not reach you, we will leave a message.     If any biopsies were taken you will be contacted by phone or by letter within the next 1-3 weeks.  Please call us at [phone] if you have not heard about the biopsies in 3 weeks.    SIGNATURES/CONFIDENTIALITY: You and/or your care partner have signed paperwork which will be entered into your electronic medical record.  These signatures attest to the fact that that the information above on your After Visit Summary has been reviewed and is understood.  Full responsibility of the confidentiality of this discharge information lies with you and/or your care-partner.

## 2022-04-20 NOTE — Op Note (Signed)
[facility] Patient Name: Angel Tucker Procedure Date: 04/20/2022 3:43 PM MRN: [id number] Endoscopist: Docia Chuck. Henrene Pastor , MD, [id number] Age: 52 Referring MD:  Date of Birth: 1970/09/06 Gender: Female Account #: [id number] Procedure:                Upper GI endoscopy Indications:              Nausea with vomiting (previously, has resolved),                            Weight loss (stablized). Medicines:                Monitored Anesthesia Care Procedure:                Pre-Anesthesia Assessment:                           - Prior to the procedure, a History and Physical                            was performed, and patient medications and                            allergies were reviewed. The patient's tolerance of                            previous anesthesia was also reviewed. The risks                            and benefits of the procedure and the sedation                            options and risks were discussed with the patient.                            All questions were answered, and informed consent                            was obtained. Prior Anticoagulants: The patient has                            taken no anticoagulant or antiplatelet agents. ASA                            Grade Assessment: II - A patient with mild systemic                            disease. After reviewing the risks and benefits,                            the patient was deemed in satisfactory condition to                            undergo the procedure.  After obtaining informed consent, the endoscope was                            passed under direct vision. Throughout the                            procedure, the patient's blood pressure, pulse, and                            oxygen saturations were monitored continuously. The                            GIF [id number] was introduced through the                            mouth, and advanced to the  second part of duodenum.                            The upper GI endoscopy was accomplished without                            difficulty. The patient tolerated the procedure                            well. Scope In: Scope Out: Findings:                 The esophagus was normal.                           The stomach was normal. Small hiatal hernia.                           The examined duodenum was normal.                           The cardia and gastric fundus were normal on                            retroflexion. Complications:            No immediate complications. Estimated Blood Loss:     Estimated blood loss: none. Impression:               - Normal esophagus.                           - Normal stomach. Small hiatal hernia.                           - Normal examined duodenum.                           - No specimens collected. Recommendation:           - Patient has a contact number available for  emergencies. The signs and symptoms of potential                            delayed complications were discussed with the                            patient. Return to normal activities tomorrow.                            Written discharge instructions were provided to the                            patient.                           - Resume previous diet.                           - Continue present medications.                           - Return to the care of your PCP. Docia Chuck. Henrene Pastor, MD 04/20/2022 4:21:59 PM This report has been signed electronically.

## 2022-04-20 NOTE — Progress Notes (Signed)
Pt's states no medical or surgical changes since previsit or office visit. VS assessed by C.W 

## 2022-04-20 NOTE — Progress Notes (Deleted)
Called to room to assist during endoscopic procedure.  Patient ID and intended procedure confirmed with present staff. Received instructions for my participation in the procedure from the performing physician.
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
## 2022-04-20 NOTE — Progress Notes (Signed)
Sedate, gd SR, tolerated procedure well, VSS, report to RN 

## 2022-04-20 NOTE — Progress Notes (Signed)
Called to room to assist during endoscopic procedure.  Patient ID and intended procedure confirmed with present staff. Received instructions for my participation in the procedure from the performing physician.

## 2022-04-21 ENCOUNTER — Telehealth: Payer: Self-pay | Admitting: *Deleted

## 2022-04-21 NOTE — Telephone Encounter (Signed)
  Follow up Call-     04/20/2022    2:58 PM  Call back number  Post procedure Call Back phone  # [phone]  Permission to leave phone message Yes    No. Patient questions:  Do you have a fever, pain , or abdominal swelling? No. Pain Score  0 *  Have you tolerated food without any problems? Yes.    Have you been able to return to your normal activities? Yes.    Do you have any questions about your discharge instructions: Diet   No. Medications  No. Follow up visit  No.  Do you have questions or concerns about your Care? No.  Actions: * If pain score is 4 or above: No action needed, pain <4.

## 2022-04-23 ENCOUNTER — Encounter: Payer: Self-pay | Admitting: Internal Medicine

## 2022-07-08 ENCOUNTER — Other Ambulatory Visit: Payer: Self-pay | Admitting: Family

## 2022-08-26 IMAGING — CT CT HEART MORP W/ CTA COR W/ SCORE W/ CA W/CM &/OR W/O CM
4 of 7 series · 8 of 20 positions shown, 9 images · IV contrast (APPLIED)
Comparison: None Available.
COMPARISON: None Available.

Addendum:
EXAM:
OVER-READ INTERPRETATION  CT CHEST

The following report is a limited chest CT over-read performed by
09/18/2021. This over-read does not include interpretation of cardiac
or coronary anatomy or pathology. The coronary artery calcium
score/cardiac CTA interpretation by the cardiologist is attached.
HISTORY: Chest pain, nonspecific
Cardiac/Coronary CT
TECHNIQUE: The patient was scanned on a Siemens Force scanner.
PROTOCOL: A 130 kV prospective scan was triggered in the descending thoracic
aorta at 111 HU's. Axial non-contrast 3 mm slices were carried out
through the heart. The data set was analyzed on a dedicated work
station and scored using the Agatston method. Gantry rotation speed
was 250 msecs and collimation was .6 mm. Heart rate was optimized
medically and sl NTG was given. The 3D data set was reconstructed in
5% intervals of the 35-75 % of the R-R cycle. Systolic and diastolic
phases were analyzed on a dedicated work station using MPR, MIP and
VRT modes. The patient received 100mL OMNIPAQUE IOHEXOL 350 MG/ML
SOLN of contrast.

[Series 6: ts diast sharp · axial · 0.39mm/px · z∈[+1303,+1334]mm · 2 of 234 slices shown]
[im 78/234  lung]
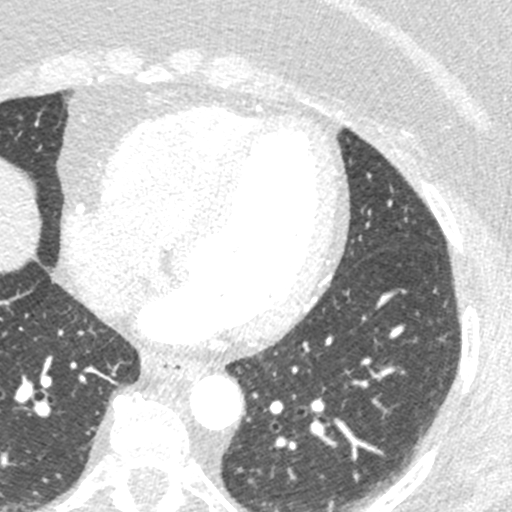
[im 156/234  lung]
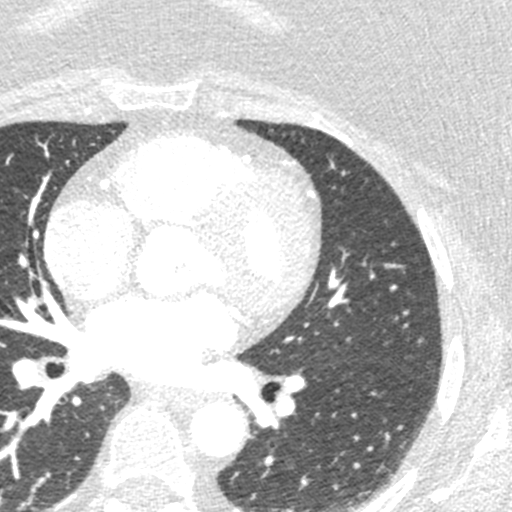

[Series 7: best diast · axial · 0.39mm/px · z∈[+1303,+1334]mm · 2 of 234 slices shown, 3 images]
[im 78/234  vessel]
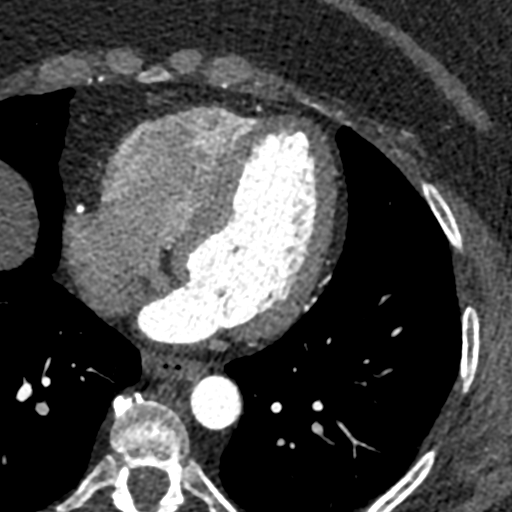
[im 78/234  lung]
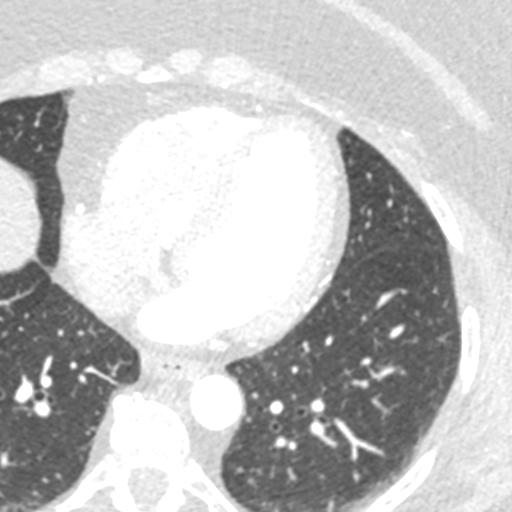
[im 156/234  vessel]
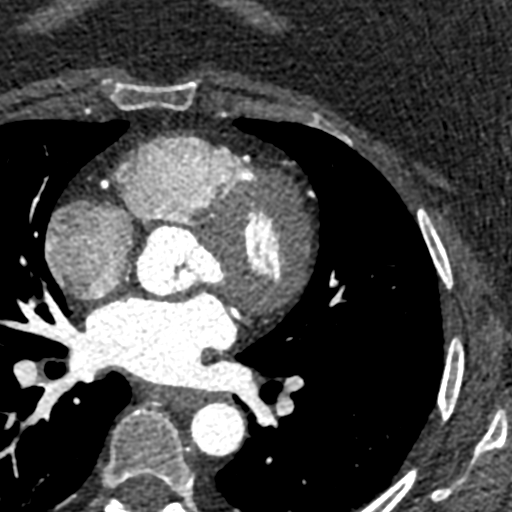

[Series 8: ts syst sharp · axial · 0.39mm/px · z∈[+1303,+1334]mm · 2 of 234 slices shown]
[im 78/234  lung]
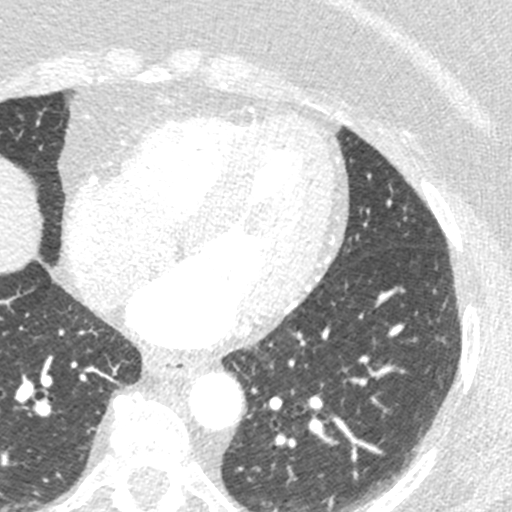
[im 156/234  lung]
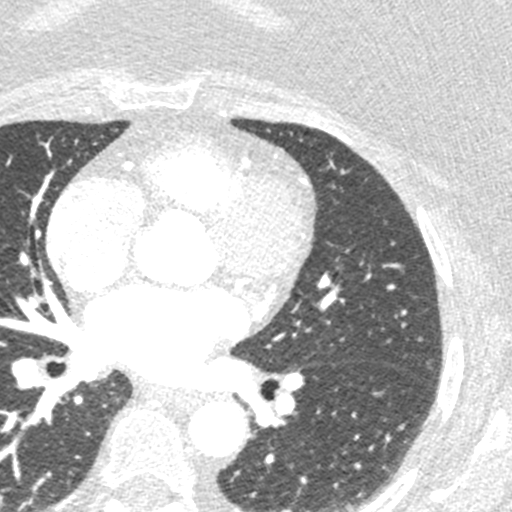

[Series 9: best syst · axial · 0.39mm/px · z∈[+1304,+1334]mm · 2 of 226 slices shown]
[im 76/226  vessel]
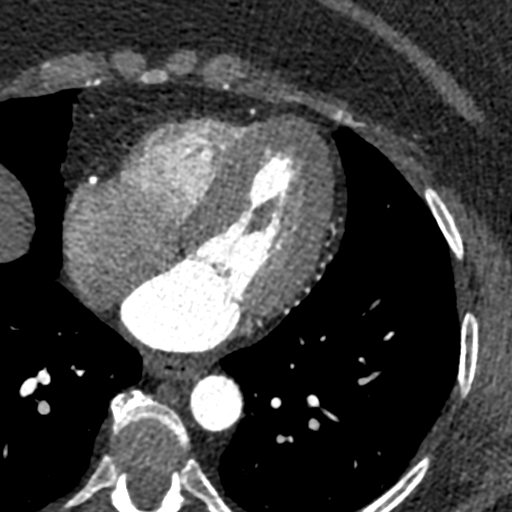
[im 151/226  vessel]
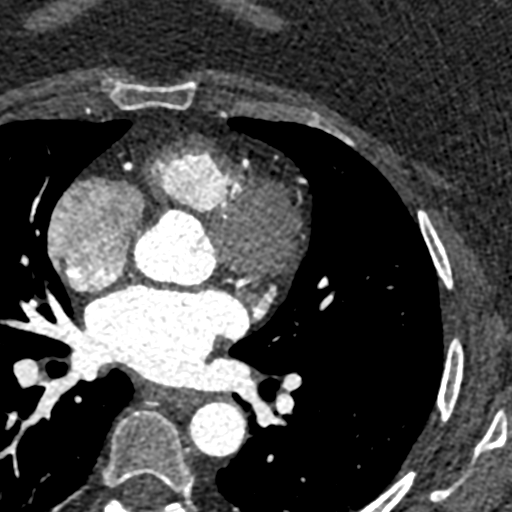

[8 of 20 positions shown; findings below may reference images not displayed]

FINDINGS: Vascular: No significant noncardiac vascular finding.

Mediastinum/Nodes: Small hiatal hernia. No suspicious mediastinal or
hilar lymph nodes within the acquired field-of-view.

Lungs/Pleura: Within the visualized portions of the thorax there are
no suspicious pulmonary nodules or masses, no focal airspace
consolidation and no pleural effusion or pneumothorax

Upper Abdomen: No acute abnormality.

Musculoskeletal: Mild thoracic spondylosis.
IMPRESSION: No significant noncardiac finding.
FINDINGS: Coronary calcium score: The patient's coronary artery calcium score
is 0, which places the patient in the 0 percentile.

Coronary arteries: Normal coronary origins.  Right dominance.

Right Coronary Artery: Normal caliber vessel, gives rise to PDA. No
significant plaque or stenosis.

Left Main Coronary Artery: Normal caliber vessel. No significant
plaque or stenosis.

Left Anterior Descending Coronary Artery: Normal caliber vessel. No
significant plaque or stenosis. Gives rise to large first and small
second diagonal branches.

Left Circumflex Artery: Normal caliber vessel. No significant plaque
or stenosis. Gives rise to normal first, large second OM branches.

Aorta: Normal size, 28 mm at the mid ascending aorta (level of the
PA bifurcation) measured double oblique. No aortic atherosclerosis.
No dissection seen in visualized portions of the aorta.

Aortic Valve: No calcifications. Trileaflet.

Other findings:

Normal pulmonary vein drainage into the left atrium.

Normal left atrial appendage without a thrombus.

Normal size of the pulmonary artery.

Normal appearance of the pericardium.
IMPRESSION: 1. No evidence of CAD, CADRADS = 0.

2. Coronary calcium score of 0. This was 0 percentile for age and
sex matched control.

3. Normal coronary origin with right dominance.

INTERPRETATION:

1. CAD-RADS 0: No evidence of CAD (0%). Consider non-atherosclerotic
causes of chest pain.

2. CAD-RADS 1: Minimal non-obstructive CAD (0-24%). Consider
non-atherosclerotic causes of chest pain. Consider preventive
therapy and risk factor modification.

3. CAD-RADS 2: Mild non-obstructive CAD (25-49%). Consider
non-atherosclerotic causes of chest pain. Consider preventive
therapy and risk factor modification.

4. CAD-RADS 3: Moderate stenosis (50-69%). Consider symptom-guided
anti-ischemic pharmacotherapy as well as risk factor modification
per guideline directed care. Additional analysis with CT FFR will be
submitted.

5. CAD-RADS 4: Severe stenosis. (70-99% or > 50% left main). Cardiac
catheterization or CT FFR is recommended. Consider symptom-guided
anti-ischemic pharmacotherapy as well as risk factor modification
per guideline directed care. Invasive coronary angiography
recommended with revascularization per published guideline
statements.

6. CAD-RADS 5: Total coronary occlusion (100%). Consider cardiac
catheterization or viability assessment. Consider symptom-guided
anti-ischemic pharmacotherapy as well as risk factor modification
per guideline directed care.

7. CAD-RADS N: Non-diagnostic study. Obstructive CAD can't be
excluded. Alternative evaluation is recommended.

*** End of Addendum ***
EXAM:
OVER-READ INTERPRETATION  CT CHEST

The following report is a limited chest CT over-read performed by
09/18/2021. This over-read does not include interpretation of cardiac
or coronary anatomy or pathology. The coronary artery calcium
score/cardiac CTA interpretation by the cardiologist is attached.
FINDINGS: Vascular: No significant noncardiac vascular finding.

Mediastinum/Nodes: Small hiatal hernia. No suspicious mediastinal or
hilar lymph nodes within the acquired field-of-view.

Lungs/Pleura: Within the visualized portions of the thorax there are
no suspicious pulmonary nodules or masses, no focal airspace
consolidation and no pleural effusion or pneumothorax

Upper Abdomen: No acute abnormality.

Musculoskeletal: Mild thoracic spondylosis.
IMPRESSION: No significant noncardiac finding.

## 2022-10-21 ENCOUNTER — Other Ambulatory Visit: Payer: Self-pay | Admitting: Family

## 2022-11-13 ENCOUNTER — Ambulatory Visit (INDEPENDENT_AMBULATORY_CARE_PROVIDER_SITE_OTHER): Payer: 59 | Admitting: Family

## 2022-11-13 VITALS — BP 150/104 | HR 76 | Temp 99.5°F | Resp 19 | Ht 63.0 in | Wt 197.2 lb

## 2022-11-13 DIAGNOSIS — E119 Type 2 diabetes mellitus without complications: Secondary | ICD-10-CM

## 2022-11-13 DIAGNOSIS — Z720 Tobacco use: Secondary | ICD-10-CM

## 2022-11-13 DIAGNOSIS — Z1231 Encounter for screening mammogram for malignant neoplasm of breast: Secondary | ICD-10-CM

## 2022-11-13 DIAGNOSIS — I10 Essential (primary) hypertension: Secondary | ICD-10-CM | POA: Diagnosis not present

## 2022-11-13 DIAGNOSIS — Z7984 Long term (current) use of oral hypoglycemic drugs: Secondary | ICD-10-CM

## 2022-11-13 DIAGNOSIS — F419 Anxiety disorder, unspecified: Secondary | ICD-10-CM

## 2022-11-13 LAB — CBC WITH DIFFERENTIAL/PLATELET
Absolute Monocytes: 410 cells/uL (ref 200–950)
Basophils Absolute: 51 cells/uL (ref 0–200)
Basophils Relative: 0.8 %
Eosinophils Absolute: 109 cells/uL (ref 15–500)
Eosinophils Relative: 1.7 %
HCT: 41.3 % (ref 35.0–45.0)
Hemoglobin: 13.4 g/dL (ref 11.7–15.5)
Lymphs Abs: 3174 cells/uL (ref 850–3900)
MCH: 27.1 pg (ref 27.0–33.0)
MCHC: 32.4 g/dL (ref 32.0–36.0)
MCV: 83.6 fL (ref 80.0–100.0)
MPV: 11.5 fL (ref 7.5–12.5)
Monocytes Relative: 6.4 %
Neutro Abs: 2656 cells/uL (ref 1500–7800)
Neutrophils Relative %: 41.5 %
Platelets: 219 10*3/uL (ref 140–400)
RBC: 4.94 10*6/uL (ref 3.80–5.10)
RDW: 13.6 % (ref 11.0–15.0)
Total Lymphocyte: 49.6 %
WBC: 6.4 10*3/uL (ref 3.8–10.8)

## 2022-11-13 MED ORDER — AMLODIPINE-VALSARTAN-HCTZ 10-320-25 MG PO TABS: ORAL_TABLET | ORAL | 1 refills | Status: DC
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
## 2022-11-14 LAB — LIPID PANEL
Cholesterol: 196 mg/dL (ref <200)
HDL: 38 mg/dL — ABNORMAL LOW (ref >=50)
LDL Cholesterol (Calc): 135 mg/dL — ABNORMAL HIGH
Non-HDL Cholesterol (Calc): 158 mg/dL — ABNORMAL HIGH (ref <130)
Total CHOL/HDL Ratio: 5.2 (calc) — ABNORMAL HIGH (ref <5.0)
Triglycerides: 120 mg/dL (ref <150)

## 2022-11-14 LAB — COMPREHENSIVE METABOLIC PANEL
AG Ratio: 1.4 (calc) (ref 1.0–2.5)
ALT: 11 U/L (ref 6–29)
AST: 15 U/L (ref 10–35)
Albumin: 4.4 g/dL (ref 3.6–5.1)
Alkaline phosphatase (APISO): 91 U/L (ref 37–153)
BUN: 8 mg/dL (ref 7–25)
CO2: 24 mmol/L (ref 20–32)
Calcium: 9.7 mg/dL (ref 8.6–10.4)
Chloride: 105 mmol/L (ref 98–110)
Creat: 0.96 mg/dL (ref 0.50–1.03)
Globulin: 3.1 g/dL (ref 1.9–3.7)
Glucose, Bld: 80 mg/dL (ref 65–99)
Potassium: 4.3 mmol/L (ref 3.5–5.3)
Sodium: 141 mmol/L (ref 135–146)
Total Bilirubin: 0.5 mg/dL (ref 0.2–1.2)
Total Protein: 7.5 g/dL (ref 6.1–8.1)

## 2022-11-14 LAB — HEMOGLOBIN A1C
Hgb A1c MFr Bld: 7.1 %{Hb} — ABNORMAL HIGH (ref <5.7)
Mean Plasma Glucose: 157 mg/dL
eAG (mmol/L): 8.7 mmol/L

## 2022-11-16 ENCOUNTER — Other Ambulatory Visit: Payer: Self-pay | Admitting: Family

## 2022-11-16 MED ORDER — RYBELSUS 3 MG PO TABS: 3.0000 mg | ORAL_TABLET | Freq: Every day | ORAL | 2 refills | Status: DC

## 2022-11-17 ENCOUNTER — Other Ambulatory Visit: Payer: Self-pay

## 2022-11-17 ENCOUNTER — Other Ambulatory Visit: Payer: Self-pay | Admitting: Family

## 2022-11-17 ENCOUNTER — Other Ambulatory Visit (HOSPITAL_COMMUNITY): Payer: Self-pay

## 2022-11-17 MED ORDER — TRAZODONE HCL 50 MG PO TABS
50.0000 mg | ORAL_TABLET | Freq: Every day | ORAL | 1 refills | Status: AC
Start: 2022-11-17 — End: ?
  Filled 2022-11-17: qty 30, 30d supply, fill #0

## 2022-11-20 ENCOUNTER — Other Ambulatory Visit: Payer: Self-pay | Admitting: Family

## 2022-11-20 MED ORDER — ROSUVASTATIN CALCIUM 10 MG PO TABS: 10.0000 mg | ORAL_TABLET | Freq: Every day | ORAL | 3 refills | Status: AC

## 2022-11-26 ENCOUNTER — Ambulatory Visit
Admission: RE | Admit: 2022-11-26 | Discharge: 2022-11-26 | Disposition: A | Discharge status: Home / Self Care | Payer: 59 | Source: Ambulatory Visit | Attending: Family | Admitting: Family

## 2022-11-26 DIAGNOSIS — Z1231 Encounter for screening mammogram for malignant neoplasm of breast: Secondary | ICD-10-CM

## 2022-11-30 ENCOUNTER — Encounter: Payer: Self-pay | Admitting: Family

## 2022-12-01 ENCOUNTER — Other Ambulatory Visit: Payer: Self-pay | Admitting: Family

## 2022-12-29 ENCOUNTER — Encounter: Payer: Self-pay | Admitting: Family

## 2023-01-04 ENCOUNTER — Other Ambulatory Visit: Payer: Self-pay | Admitting: Family

## 2023-01-04 MED ORDER — AMLODIPINE-VALSARTAN-HCTZ 10-320-25 MG PO TABS: ORAL_TABLET | ORAL | 1 refills | Status: DC

## 2023-01-26 ENCOUNTER — Telehealth: Payer: Self-pay | Admitting: Family

## 2023-01-26 NOTE — Telephone Encounter (Signed)
Spoke with pts pharmacy, was told by pharmacy staff pt picked up 3 bottles (90 day supply) of her Exforge HCT on 08/16. Pharmacy staff also stated it is too early for pt to get another refill.

## 2023-01-26 NOTE — Telephone Encounter (Signed)
Called patient to reschedule appt on 11/22 due to provider being out of office. During our call patient said that she only received 30 days of the amLODIPine-Valsartan-HCTZ (EXFORGE HCT) 10-320-25 MG TABS instead of the 90 days that provider sent in. Pt said she is taking the other two medications (minus amlodipine) since she is not due for another refill until 11/7. I asked patient if she called pharmacy to see if they are going to give her the other 60 days worth of her medicine but pt said that they took a picture of the prescription and said that they did not short her. Please follow up with pharmacy and advise patient.

## 2023-02-19 ENCOUNTER — Ambulatory Visit: Payer: 59 | Admitting: Family

## 2023-03-19 ENCOUNTER — Encounter: Payer: Self-pay | Admitting: Family

## 2023-03-19 ENCOUNTER — Other Ambulatory Visit: Payer: Self-pay | Admitting: Family

## 2023-03-19 ENCOUNTER — Ambulatory Visit (INDEPENDENT_AMBULATORY_CARE_PROVIDER_SITE_OTHER): Payer: 59 | Admitting: Family

## 2023-03-19 VITALS — BP 136/82 | HR 81 | Ht 63.0 in | Wt 187.0 lb

## 2023-03-19 DIAGNOSIS — R7989 Other specified abnormal findings of blood chemistry: Secondary | ICD-10-CM | POA: Diagnosis not present

## 2023-03-19 DIAGNOSIS — E119 Type 2 diabetes mellitus without complications: Secondary | ICD-10-CM | POA: Diagnosis not present

## 2023-03-19 DIAGNOSIS — I10 Essential (primary) hypertension: Secondary | ICD-10-CM | POA: Diagnosis not present

## 2023-03-19 DIAGNOSIS — Z720 Tobacco use: Secondary | ICD-10-CM

## 2023-03-19 DIAGNOSIS — M79609 Pain in unspecified limb: Secondary | ICD-10-CM

## 2023-03-19 DIAGNOSIS — Z7984 Long term (current) use of oral hypoglycemic drugs: Secondary | ICD-10-CM

## 2023-03-19 LAB — CBC WITH DIFFERENTIAL/PLATELET
Absolute Lymphocytes: 2263 {cells}/uL (ref 850–3900)
Absolute Monocytes: 351 {cells}/uL (ref 200–950)
Basophils Absolute: 22 {cells}/uL (ref 0–200)
Basophils Relative: 0.4 %
Eosinophils Absolute: 130 {cells}/uL (ref 15–500)
Eosinophils Relative: 2.4 %
HCT: 39.6 % (ref 35.0–45.0)
Hemoglobin: 12.8 g/dL (ref 11.7–15.5)
MCH: 27.9 pg (ref 27.0–33.0)
MCHC: 32.3 g/dL (ref 32.0–36.0)
MCV: 86.5 fL (ref 80.0–100.0)
MPV: 10.2 fL (ref 7.5–12.5)
Monocytes Relative: 6.5 %
Neutro Abs: 2635 {cells}/uL (ref 1500–7800)
Neutrophils Relative %: 48.8 %
Platelets: 245 10*3/uL (ref 140–400)
RBC: 4.58 10*6/uL (ref 3.80–5.10)
RDW: 13.6 % (ref 11.0–15.0)
Total Lymphocyte: 41.9 %
WBC: 5.4 10*3/uL (ref 3.8–10.8)
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
## 2023-03-20 LAB — HEMOGLOBIN A1C
Hgb A1c MFr Bld: 6.5 %{Hb} — ABNORMAL HIGH (ref <5.7)
Mean Plasma Glucose: 140 mg/dL
eAG (mmol/L): 7.7 mmol/L

## 2023-03-20 LAB — COMPREHENSIVE METABOLIC PANEL
AG Ratio: 1.5 (calc) (ref 1.0–2.5)
ALT: 11 U/L (ref 6–29)
AST: 14 U/L (ref 10–35)
Albumin: 4.4 g/dL (ref 3.6–5.1)
Alkaline phosphatase (APISO): 87 U/L (ref 37–153)
BUN: 10 mg/dL (ref 7–25)
CO2: 26 mmol/L (ref 20–32)
Calcium: 9.5 mg/dL (ref 8.6–10.4)
Chloride: 105 mmol/L (ref 98–110)
Creat: 0.79 mg/dL (ref 0.50–1.03)
Globulin: 3 g/dL (ref 1.9–3.7)
Glucose, Bld: 99 mg/dL (ref 65–99)
Potassium: 3.9 mmol/L (ref 3.5–5.3)
Sodium: 144 mmol/L (ref 135–146)
Total Bilirubin: 0.5 mg/dL (ref 0.2–1.2)
Total Protein: 7.4 g/dL (ref 6.1–8.1)

## 2023-03-20 LAB — VITAMIN B12: Vitamin B-12: 496 pg/mL (ref 200–1100)

## 2023-03-20 LAB — MICROALBUMIN / CREATININE URINE RATIO
Creatinine, Urine: 192 mg/dL (ref 20–275)
Microalb Creat Ratio: 4 mg/g{creat} (ref <30)
Microalb, Ur: 0.8 mg/dL

## 2023-03-20 LAB — MAGNESIUM: Magnesium: 1.9 mg/dL (ref 1.5–2.5)

## 2023-03-22 ENCOUNTER — Encounter: Payer: Self-pay | Admitting: Family

## 2023-04-01 ENCOUNTER — Other Ambulatory Visit: Payer: Self-pay | Admitting: Family

## 2023-04-01 MED ORDER — TIRZEPATIDE 2.5 MG/0.5ML ~~LOC~~ SOAJ: 2.5000 mg | SUBCUTANEOUS | 0 refills | Status: DC

## 2023-04-20 ENCOUNTER — Other Ambulatory Visit: Payer: Self-pay | Admitting: Family

## 2023-04-20 MED ORDER — TIRZEPATIDE 5 MG/0.5ML ~~LOC~~ SOAJ: 5.0000 mg | SUBCUTANEOUS | 2 refills | Status: DC

## 2023-05-31 ENCOUNTER — Encounter: Payer: Self-pay | Admitting: Family

## 2023-06-01 ENCOUNTER — Other Ambulatory Visit: Payer: Self-pay | Admitting: Family

## 2023-06-01 MED ORDER — AMLODIPINE-VALSARTAN-HCTZ 10-320-25 MG PO TABS: ORAL_TABLET | ORAL | 1 refills | Status: AC

## 2023-06-01 MED ORDER — METFORMIN HCL 500 MG PO TABS
500.0000 mg | ORAL_TABLET | Freq: Two times a day (BID) | ORAL | 0 refills | Status: DC
Start: 2023-06-01 — End: 2023-09-30

## 2023-06-06 ENCOUNTER — Encounter: Payer: Self-pay | Admitting: Family

## 2023-06-06 ENCOUNTER — Inpatient Hospital Stay: Admission: RE | Admit: 2023-06-06 | Source: Ambulatory Visit

## 2023-06-08 ENCOUNTER — Other Ambulatory Visit: Payer: Self-pay | Admitting: Family

## 2023-06-08 ENCOUNTER — Ambulatory Visit: Admitting: Family

## 2023-06-08 DIAGNOSIS — Z77120 Contact with and (suspected) exposure to mold (toxic): Secondary | ICD-10-CM

## 2023-06-29 ENCOUNTER — Ambulatory Visit: Admitting: Family

## 2023-07-02 ENCOUNTER — Ambulatory Visit: Payer: 59 | Admitting: Family

## 2023-07-13 ENCOUNTER — Other Ambulatory Visit: Payer: Self-pay | Admitting: Family

## 2023-07-13 MED ORDER — TIRZEPATIDE 5 MG/0.5ML ~~LOC~~ SOAJ: 5.0000 mg | SUBCUTANEOUS | 2 refills | Status: DC

## 2023-09-30 ENCOUNTER — Encounter: Payer: Self-pay | Admitting: Family

## 2023-09-30 ENCOUNTER — Ambulatory Visit: Payer: Self-pay | Admitting: Family

## 2023-09-30 ENCOUNTER — Ambulatory Visit (INDEPENDENT_AMBULATORY_CARE_PROVIDER_SITE_OTHER): Admitting: Family

## 2023-09-30 VITALS — BP 112/68 | HR 82 | Ht 63.0 in | Wt 157.2 lb

## 2023-09-30 DIAGNOSIS — R7309 Other abnormal glucose: Secondary | ICD-10-CM | POA: Diagnosis not present

## 2023-09-30 DIAGNOSIS — I10 Essential (primary) hypertension: Secondary | ICD-10-CM

## 2023-09-30 DIAGNOSIS — J069 Acute upper respiratory infection, unspecified: Secondary | ICD-10-CM

## 2023-09-30 LAB — COMPREHENSIVE METABOLIC PANEL WITH GFR
ALT: 7 U/L (ref 0–35)
AST: 12 U/L (ref 0–37)
Albumin: 4.3 g/dL (ref 3.5–5.2)
Alkaline Phosphatase: 56 U/L (ref 39–117)
BUN: 9 mg/dL (ref 6–23)
CO2: 31 meq/L (ref 19–32)
Calcium: 9.4 mg/dL (ref 8.4–10.5)
Chloride: 105 meq/L (ref 96–112)
Creatinine, Ser: 0.91 mg/dL (ref 0.40–1.20)
GFR: 72.64 mL/min (ref >60.00)
Glucose, Bld: 83 mg/dL (ref 70–99)
Potassium: 4.1 meq/L (ref 3.5–5.1)
Sodium: 142 meq/L (ref 135–145)
Total Bilirubin: 0.4 mg/dL (ref 0.2–1.2)
Total Protein: 7.3 g/dL (ref 6.0–8.3)

## 2023-09-30 LAB — CBC WITH DIFFERENTIAL/PLATELET
Basophils Absolute: 0 10*3/uL (ref 0.0–0.1)
Basophils Relative: 0.6 % (ref 0.0–3.0)
Eosinophils Absolute: 0.1 10*3/uL (ref 0.0–0.7)
Eosinophils Relative: 1.2 % (ref 0.0–5.0)
HCT: 35.7 % — ABNORMAL LOW (ref 36.0–46.0)
Hemoglobin: 11.6 g/dL — ABNORMAL LOW (ref 12.0–15.0)
Lymphocytes Relative: 41.5 % (ref 12.0–46.0)
Lymphs Abs: 2.2 10*3/uL (ref 0.7–4.0)
MCHC: 32.6 g/dL (ref 30.0–36.0)
MCV: 84.3 fl (ref 78.0–100.0)
Monocytes Absolute: 0.3 10*3/uL (ref 0.1–1.0)
Monocytes Relative: 4.9 % (ref 3.0–12.0)
Neutro Abs: 2.8 10*3/uL (ref 1.4–7.7)
Neutrophils Relative %: 51.8 % (ref 43.0–77.0)
Platelets: 206 10*3/uL (ref 150.0–400.0)
RBC: 4.23 Mil/uL (ref 3.87–5.11)
RDW: 14.1 % (ref 11.5–15.5)
WBC: 5.3 10*3/uL (ref 4.0–10.5)

## 2023-09-30 LAB — HEMOGLOBIN A1C: Hgb A1c MFr Bld: 6 % (ref 4.6–6.5)
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
## 2023-09-30 NOTE — Addendum Note (Signed)
 Addended by: ESTELLE GILLIS D on: 09/30/2023 04:58 PM   Modules accepted: Orders

## 2023-11-02 ENCOUNTER — Other Ambulatory Visit: Payer: Self-pay

## 2023-11-02 ENCOUNTER — Emergency Department (HOSPITAL_COMMUNITY)
Admission: EM | Admit: 2023-11-02 | Discharge: 2023-11-02 | Disposition: A | Discharge status: Home / Self Care | Attending: Emergency Medicine | Admitting: Emergency Medicine

## 2023-11-02 ENCOUNTER — Emergency Department (HOSPITAL_COMMUNITY)

## 2023-11-02 ENCOUNTER — Encounter (HOSPITAL_COMMUNITY): Payer: Self-pay | Admitting: *Deleted

## 2023-11-02 DIAGNOSIS — E876 Hypokalemia: Secondary | ICD-10-CM | POA: Diagnosis not present

## 2023-11-02 DIAGNOSIS — M79602 Pain in left arm: Secondary | ICD-10-CM | POA: Diagnosis not present

## 2023-11-02 DIAGNOSIS — R200 Anesthesia of skin: Secondary | ICD-10-CM | POA: Diagnosis present

## 2023-11-02 DIAGNOSIS — R0789 Other chest pain: Secondary | ICD-10-CM | POA: Insufficient documentation

## 2023-11-02 LAB — CBC WITH DIFFERENTIAL/PLATELET
Abs Immature Granulocytes: 0.01 K/uL (ref 0.00–0.07)
Basophils Absolute: 0 K/uL (ref 0.0–0.1)
Basophils Relative: 1 %
Eosinophils Absolute: 0.1 K/uL (ref 0.0–0.5)
Eosinophils Relative: 1 %
HCT: 39 % (ref 36.0–46.0)
Hemoglobin: 12.8 g/dL (ref 12.0–15.0)
Immature Granulocytes: 0 %
Lymphocytes Relative: 44 %
Lymphs Abs: 2.5 K/uL (ref 0.7–4.0)
MCH: 28.2 pg (ref 26.0–34.0)
MCHC: 32.8 g/dL (ref 30.0–36.0)
MCV: 85.9 fL (ref 80.0–100.0)
Monocytes Absolute: 0.3 K/uL (ref 0.1–1.0)
Monocytes Relative: 5 %
Neutro Abs: 2.9 K/uL (ref 1.7–7.7)
Neutrophils Relative %: 49 %
Platelets: 241 K/uL (ref 150–400)
RBC: 4.54 MIL/uL (ref 3.87–5.11)
RDW: 15.4 % (ref 11.5–15.5)
WBC: 5.7 K/uL (ref 4.0–10.5)
nRBC: 0 % (ref 0.0–0.2)

## 2023-11-02 LAB — COMPREHENSIVE METABOLIC PANEL WITH GFR
ALT: 10 U/L (ref 0–44)
AST: 15 U/L (ref 15–41)
Albumin: 4.5 g/dL (ref 3.5–5.0)
Alkaline Phosphatase: 75 U/L (ref 38–126)
Anion gap: 12 (ref 5–15)
BUN: 7 mg/dL (ref 6–20)
CO2: 26 mmol/L (ref 22–32)
Calcium: 9.8 mg/dL (ref 8.9–10.3)
Chloride: 102 mmol/L (ref 98–111)
Creatinine, Ser: 0.8 mg/dL (ref 0.44–1.00)
GFR, Estimated: >60 mL/min (ref >60)
Glucose, Bld: 92 mg/dL (ref 70–99)
Potassium: 3 mmol/L — ABNORMAL LOW (ref 3.5–5.1)
Sodium: 140 mmol/L (ref 135–145)
Total Bilirubin: 0.6 mg/dL (ref 0.0–1.2)
Total Protein: 8.4 g/dL — ABNORMAL HIGH (ref 6.5–8.1)

## 2023-11-02 LAB — LIPASE, BLOOD: Lipase: 36 U/L (ref 11–51)

## 2023-11-02 LAB — TROPONIN I (HIGH SENSITIVITY): Troponin I (High Sensitivity): 3 ng/L (ref <18)

## 2023-11-02 MED ORDER — IBUPROFEN 600 MG PO TABS: 600.0000 mg | ORAL_TABLET | Freq: Four times a day (QID) | ORAL | 0 refills | Status: AC | PRN

## 2023-11-02 MED ORDER — POTASSIUM CHLORIDE CRYS ER 20 MEQ PO TBCR: 20.0000 meq | EXTENDED_RELEASE_TABLET | Freq: Two times a day (BID) | ORAL | 0 refills | Status: AC

## 2023-11-02 MED ORDER — METHOCARBAMOL 500 MG PO TABS: 1000.0000 mg | ORAL_TABLET | Freq: Three times a day (TID) | ORAL | 0 refills | Status: AC

## 2023-11-02 MED ORDER — OXYCODONE-ACETAMINOPHEN 5-325 MG PO TABS: 1.0000 | ORAL_TABLET | Freq: Four times a day (QID) | ORAL | 0 refills | Status: AC | PRN

## 2023-11-02 NOTE — ED Notes (Addendum)
 Pt verbalized intention to leave. Spoke with pt, deciding to stay.

## 2023-11-02 NOTE — Discharge Instructions (Signed)
 Follow up with your doctor if pain continues. Take the medications as prescribed. Warm compresses to the sore areas may help.   Return to the ED with any new or concerning symptoms at any time.

## 2023-11-02 NOTE — ED Provider Notes (Signed)
 [name]   CSN: [id number] Arrival date & time: 11/02/23  1221     Patient presents with: Chest Pain   Angel Tucker is a 53 y.o. female.   Patient to ED for evaluation of sharp, stabbing left upper chest pain for the past 3 days. The pain radiates to the back and also extends to entire left arm. No weakness or numbness of the arm. No SOB, cough or fever. She denies nausea, vomiting. Last medication change was nearly one month ago when she was taken off her Mounjaro .   The history is provided by the patient. No language interpreter was used.  Chest Pain      Prior to Admission medications   Medication Sig Start Date End Date Taking? Authorizing Provider  ibuprofen  (ADVIL ) 600 MG tablet Take 1 tablet (600 mg total) by mouth every 6 (six) hours as needed. 11/02/23  Yes Zymiere Trostle, Margit, PA-C  methocarbamol  (ROBAXIN ) 500 MG tablet Take 2 tablets (1,000 mg total) by mouth 3 (three) times daily. 11/02/23  Yes Odell Margit, PA-C  oxyCODONE -acetaminophen  (PERCOCET/ROXICET) 5-325 MG tablet Take 1 tablet by mouth every 6 (six) hours as needed for severe pain (pain score 7-10). 11/02/23  Yes Kenshawn Maciolek, PA-C  potassium chloride  SA (KLOR-CON  M) 20 MEQ tablet Take 1 tablet (20 mEq total) by mouth 2 (two) times daily. 11/02/23  Yes Odell Margit, PA-C  amLODIPine -Valsartan -HCTZ (EXFORGE  HCT) 10-320-25 MG TABS Take 1 tablet daily as directed 06/01/23   Jason Leita Repine, FNP  blood glucose meter kit and supplies Dispense based on patient and insurance preference. Use up to two times daily as directed. (FOR ICD-10 E10.9, E11.9). 07/12/20   Gonfa, Taye T, MD  rosuvastatin  (CRESTOR ) 10 MG tablet Take 1 tablet (10 mg total) by mouth daily. 11/20/22   Jason Leita Repine, FNP  traZODone  (DESYREL ) 50 MG tablet Take 1 tablet (50 mg total) by mouth at bedtime. 11/17/22   Jason Leita Repine, FNP  Venlafaxine  HCl 225 MG TB24 Take 1 tablet  by mouth daily. 04/06/22   [provider]    Allergies: Pollen extract    Review of Systems  Cardiovascular:  Positive for chest pain.    Updated Vital Signs BP (!) 147/84 (BP Location: Right Arm)   Pulse 70   Temp 97.8 F (36.6 C) (Oral)   Resp 17   Wt 71.2 kg   LMP  (LMP Unknown)   SpO2 100%   BMI 27.81 kg/m   Physical Exam Constitutional:      Appearance: She is well-developed.  HENT:     Head: Normocephalic.  Cardiovascular:     Rate and Rhythm: Normal rate and regular rhythm.     Heart sounds: No murmur heard. Pulmonary:     Effort: Pulmonary effort is normal.     Breath sounds: Normal breath sounds. No wheezing, rhonchi or rales.  Abdominal:     General: Bowel sounds are normal.     Palpations: Abdomen is soft.     Tenderness: There is no abdominal tenderness. There is no guarding or rebound.  Musculoskeletal:        General: Normal range of motion.     Cervical back: Normal range of motion and neck supple.     Comments: Left arm is generally tender to palpation. No swelling, redness or warmth. Distal pulses present.   Skin:    General: Skin is warm and dry.  Neurological:  General: No focal deficit present.     Mental Status: She is alert and oriented to person, place, and time.     (all labs ordered are listed, but only abnormal results are displayed) Labs Reviewed  COMPREHENSIVE METABOLIC PANEL WITH GFR - Abnormal; Notable for the following components:      Result Value   Potassium 3.0 (*)    Total Protein 8.4 (*)    All other components within normal limits  LIPASE, BLOOD  CBC WITH DIFFERENTIAL/PLATELET  TROPONIN I (HIGH SENSITIVITY)   Results for orders placed or performed during the hospital encounter of 11/02/23  Comprehensive metabolic panel   Collection Time: 11/02/23  1:13 PM  Result Value Ref Range   Sodium 140 135 - 145 mmol/L   Potassium 3.0 (L) 3.5 - 5.1 mmol/L   Chloride 102 98 - 111 mmol/L   CO2 26 22 - 32 mmol/L    Glucose, Bld 92 70 - 99 mg/dL   BUN 7 6 - 20 mg/dL   Creatinine, Ser 9.19 0.44 - 1.00 mg/dL   Calcium  9.8 8.9 - 10.3 mg/dL   Total Protein 8.4 (H) 6.5 - 8.1 g/dL   Albumin 4.5 3.5 - 5.0 g/dL   AST 15 15 - 41 U/L   ALT 10 0 - 44 U/L   Alkaline Phosphatase 75 38 - 126 U/L   Total Bilirubin 0.6 0.0 - 1.2 mg/dL   GFR, Estimated >39 >39 mL/min   Anion gap 12 5 - 15  Lipase, blood   Collection Time: 11/02/23  1:13 PM  Result Value Ref Range   Lipase 36 11 - 51 U/L  CBC with Differential   Collection Time: 11/02/23  1:13 PM  Result Value Ref Range   WBC 5.7 4.0 - 10.5 K/uL   RBC 4.54 3.87 - 5.11 MIL/uL   Hemoglobin 12.8 12.0 - 15.0 g/dL   HCT 60.9 63.9 - 53.9 %   MCV 85.9 80.0 - 100.0 fL   MCH 28.2 26.0 - 34.0 pg   MCHC 32.8 30.0 - 36.0 g/dL   RDW 84.5 88.4 - 84.4 %   Platelets 241 150 - 400 K/uL   nRBC 0.0 0.0 - 0.2 %   Neutrophils Relative % 49 %   Neutro Abs 2.9 1.7 - 7.7 K/uL   Lymphocytes Relative 44 %   Lymphs Abs 2.5 0.7 - 4.0 K/uL   Monocytes Relative 5 %   Monocytes Absolute 0.3 0.1 - 1.0 K/uL   Eosinophils Relative 1 %   Eosinophils Absolute 0.1 0.0 - 0.5 K/uL   Basophils Relative 1 %   Basophils Absolute 0.0 0.0 - 0.1 K/uL   Immature Granulocytes 0 %   Abs Immature Granulocytes 0.01 0.00 - 0.07 K/uL  Troponin I (High Sensitivity)   Collection Time: 11/02/23  1:13 PM  Result Value Ref Range   Troponin I (High Sensitivity) 3 <18 ng/L     EKG: EKG Interpretation Date/Time:  Tuesday November 02 2023 12:55:11 EDT Ventricular Rate:  72 PR Interval:  156 QRS Duration:  74 QT Interval:  409 QTC Calculation: 448 R Axis:   59  Text Interpretation: Sinus rhythm Abnormal R-wave progression, early transition Borderline T wave abnormalities Confirmed by Cottie Cough [phone]) on 11/02/2023 3:17:00 PM  Radiology: [facility] Chest 2 View Result Date: 11/02/2023 CLINICAL DATA:  Chest pain EXAM: CHEST - 2 VIEW COMPARISON:  None Available. FINDINGS: The heart size and mediastinal  contours are within normal limits. Both lungs are clear. The visualized  skeletal structures are unremarkable. IMPRESSION: No active cardiopulmonary disease. Electronically Signed   By: Dorethia Molt M.D.   On: 11/02/2023 14:08     Procedures   Medications Ordered in the ED - No data to display  Clinical Course as of 11/02/23 2220  Tue Nov 02, 2023  1601 Patient to ED with chest pain and left arm pain x 3 days. No fever, cough, SOB. Labs, including troponin, are negative with the exception of K+ 3.0. EKG normal. Chart reviewed. Coronary CT 2022 without any obstruction, calcium  score 0 at that time.  [SU]  2218 Discussed likely MSK etiology of her pain. Robaxin  and ibuprofen  recommended. Potassium supplement x 3 days. Pain Rx also provided. Encouraged PCP follow up.  [SU]    Clinical Course User Index [SU] Odell Balls, PA-C                                 Medical Decision Making Amount and/or Complexity of Data Reviewed Labs: ordered. Radiology: ordered.  Risk Prescription drug management.        Final diagnoses:  Chest wall pain  Left arm pain  Hypokalemia    ED Discharge Orders          Ordered    methocarbamol  (ROBAXIN ) 500 MG tablet  3 times daily        11/02/23 1628    oxyCODONE -acetaminophen  (PERCOCET/ROXICET) 5-325 MG tablet  Every 6 hours PRN        11/02/23 1628    ibuprofen  (ADVIL ) 600 MG tablet  Every 6 hours PRN        11/02/23 1628    potassium chloride  SA (KLOR-CON  M) 20 MEQ tablet  2 times daily        11/02/23 1629               Odell Balls, PA-C 11/02/23 2220    Cottie Donnice PARAS, MD 11/02/23 2237

## 2023-11-02 NOTE — ED Triage Notes (Addendum)
 Here by POV from home for chest pain, and L arm numbness. Onset Friday. Describes as intermittent, comes and goes. Rates 8/10, and fluctuates. Alert, NAD, calm, interactive. Can't deny other sx/ unsure. Took BP med hydrochlorothiazide  this am and sx improved. No other meds taken. VSS. BP elevated. States, thought might be related to gas and and/or anxiety, h/o same.

## 2024-02-01 ENCOUNTER — Ambulatory Visit: Admitting: Family

## 2024-05-01 ENCOUNTER — Telehealth: Payer: Self-pay

## 2024-05-01 ENCOUNTER — Ambulatory Visit: Admitting: Family Medicine

## 2024-05-01 NOTE — Telephone Encounter (Signed)
 Attempted to reach patient in regards to visit for today, unable to LVM. Office closed due to weather.

## 2024-05-03 NOTE — Progress Notes (Unsigned)
 "  Acute Office Visit  Subjective:     Patient ID: Angel Tucker, female    DOB: 21-Apr-1970, 54 y.o.   MRN: [id number]  No chief complaint on file.   HPI  Discussed the use of AI scribe software for clinical note transcription with the patient, who gave verbal consent to proceed.  History of Present Illness Angel Tucker is a 54 year old female who presents with a painful knot on her foot.  Foot mass and pain - Painful knot present on foot for an extended period, became painful approximately one week ago - Pain described as excruciating, sharp, and toothache-like with surrounding achiness - Pain radiates up the leg and to other areas of the body - Prolonged standing and walking exacerbate pain - Soaking in Epsom salt and taking 600 mg ibuprofen  have not relieved symptoms  Functional limitation and swelling - Difficulty bending toes on the affected foot - Toes feel full and possibly swollen compared with the contralateral side - Movement of the foot is limited due to pain and swelling  Peripheral circulation and sensation - Cold feet present - Compression socks used for circulation  Diabetes mellitus - Diabetes described as in remission - No diabetes medication taken today - Patient questions possible relationship between diabetes and current foot symptoms  Gynecologic status - Not pregnant     ROS Per HPI      Objective:    BP (!) 136/94 (BP Location: Right Arm, Patient Position: Sitting)   Pulse 77   Temp 98 F (36.7 C) (Temporal)   Ht 5' 3 (1.6 m)   Wt 154 lb 9.6 oz (70.1 kg)   LMP  (LMP Unknown)   SpO2 98%   BMI 27.39 kg/m    Physical Exam Vitals and nursing note reviewed.  Constitutional:      General: She is not in acute distress.    Appearance: Normal appearance. She is normal weight.  HENT:     Head: Normocephalic and atraumatic.     Right Ear: External ear normal.     Left Ear: External ear normal.     Nose: Nose normal.      Mouth/Throat:     Mouth: Mucous membranes are moist.     Pharynx: Oropharynx is clear.  Eyes:     Extraocular Movements: Extraocular movements intact.     Pupils: Pupils are equal, round, and reactive to light.  Cardiovascular:     Rate and Rhythm: Normal rate.     Pulses: Normal pulses.  Pulmonary:     Effort: Pulmonary effort is normal.  Musculoskeletal:        General: Swelling and tenderness present.     Cervical back: Normal range of motion.     Right lower leg: No edema.     Left lower leg: No edema.       Feet:  Feet:     Comments: Area of tenderness, swelling, small round, firm, immobile mass to the area consistent with cyst-like structure Lymphadenopathy:     Cervical: No cervical adenopathy.  Neurological:     General: No focal deficit present.     Mental Status: She is alert and oriented to person, place, and time.  Psychiatric:        Mood and Affect: Mood normal.        Thought Content: Thought content normal.     No results found for any visits on 05/04/24.      Assessment & Plan:  Assessment and Plan Assessment & Plan Left foot pain with swelling and palpable small mass Chronic pain with swelling and lump, possible ganglion cyst. Diabetes in remission, neuropathy unlikely. Gabapentin  considered for nerve pain. Steroid injection discussed for inflammation. - Ordered x-ray of left foot and ankle to rule out stress fracture. - Referred to sports medicine for further evaluation and possible ultrasound or drainage if ganglion cyst is confirmed. - Prescribed gabapentin  for nerve pain management, starting with the lowest dose at night. - Administered steroid injection for inflammation and swelling, with a short course of oral steroids if needed. - Advised use of compression socks to improve circulation. - Instructed to make an appointment with sports medicine after x-ray.     Orders Placed This Encounter  Procedures   DG Foot Complete Left    Standing  Status:   Future    Number of Occurrences:   1    Expiration Date:   05/04/2025    Reason for Exam (SYMPTOM  OR DIAGNOSIS REQUIRED):   Left foot pain    Is patient pregnant?:   No    Preferred imaging location?:   [facility]   Ambulatory referral to Sports Medicine    Referral Priority:   Routine    Referral Type:   Consultation    Number of Visits Requested:   1     Meds ordered this encounter  Medications   DISCONTD: gabapentin  (NEURONTIN ) 100 MG capsule    Sig: Take 1 capsule (100 mg total) by mouth 3 (three) times daily.    Dispense:  90 capsule    Refill:  3   gabapentin  (NEURONTIN ) 100 MG capsule    Sig: Take 1 capsule (100 mg total) by mouth 3 (three) times daily.    Dispense:  90 capsule    Refill:  3   predniSONE  (DELTASONE ) 20 MG tablet    Sig: Take 2 tablets (40 mg total) by mouth daily for 5 days.    Dispense:  10 tablet    Refill:  0    Return if symptoms worsen or fail to improve.  Corean LITTIE Ku, FNP  "

## 2024-05-04 ENCOUNTER — Encounter: Payer: Self-pay | Admitting: Family Medicine

## 2024-05-04 ENCOUNTER — Ambulatory Visit: Admitting: Family Medicine

## 2024-05-04 ENCOUNTER — Ambulatory Visit: Payer: Self-pay | Admitting: Family Medicine

## 2024-05-04 ENCOUNTER — Ambulatory Visit (INDEPENDENT_AMBULATORY_CARE_PROVIDER_SITE_OTHER)

## 2024-05-04 VITALS — BP 136/94 | HR 77 | Temp 98.0°F | Ht 63.0 in | Wt 154.6 lb

## 2024-05-04 DIAGNOSIS — M79672 Pain in left foot: Secondary | ICD-10-CM | POA: Diagnosis not present

## 2024-05-04 DIAGNOSIS — R2242 Localized swelling, mass and lump, left lower limb: Secondary | ICD-10-CM

## 2024-05-04 DIAGNOSIS — M25572 Pain in left ankle and joints of left foot: Secondary | ICD-10-CM

## 2024-05-04 MED ORDER — GABAPENTIN 100 MG PO CAPS: 100.0000 mg | ORAL_CAPSULE | Freq: Three times a day (TID) | ORAL | 3 refills | Status: DC

## 2024-05-04 MED ORDER — PREDNISONE 20 MG PO TABS: 40.0000 mg | ORAL_TABLET | Freq: Every day | ORAL | 0 refills | Status: AC

## 2024-05-04 MED ORDER — GABAPENTIN 100 MG PO CAPS: 100.0000 mg | ORAL_CAPSULE | Freq: Three times a day (TID) | ORAL | 3 refills | Status: AC

## 2024-05-04 NOTE — Patient Instructions (Addendum)
 We are getting an xray today. We will be in contact with any abnormal results that require further attention.  I have sent in gabapentin  100mg  for you to take once daily as needed for nerve pain. May take 1-3 capsules as needed for pain.  I have sent in prednisone  for you to take 2 tablets once daily in the morning with breakfast for the next 5 days.  Referral sent to sports medicine today for further evaluation.

## 2024-10-03 ENCOUNTER — Encounter: Admitting: Family
